# Patient Record
Sex: Female | Born: 1955 | Race: White | Hispanic: No | Smoking: Never smoker
Health system: Southern US, Community
[De-identification: ages and names within clinical notes are randomized; demographics above are authoritative.]

## PROBLEM LIST (undated history)

## (undated) DIAGNOSIS — I10 Essential (primary) hypertension: Secondary | ICD-10-CM

## (undated) DIAGNOSIS — E66811 Obesity, class 1: Secondary | ICD-10-CM

## (undated) DIAGNOSIS — E669 Obesity, unspecified: Secondary | ICD-10-CM

## (undated) DIAGNOSIS — M1990 Unspecified osteoarthritis, unspecified site: Secondary | ICD-10-CM

## (undated) DIAGNOSIS — E7800 Pure hypercholesterolemia, unspecified: Secondary | ICD-10-CM

## (undated) DIAGNOSIS — E119 Type 2 diabetes mellitus without complications: Secondary | ICD-10-CM

## (undated) DIAGNOSIS — C541 Malignant neoplasm of endometrium: Secondary | ICD-10-CM

## (undated) HISTORY — PX: WISDOM TOOTH EXTRACTION: SHX21

## (undated) HISTORY — DX: Pure hypercholesterolemia, unspecified: E78.00

## (undated) HISTORY — DX: Obesity, unspecified: E66.9

## (undated) HISTORY — DX: Malignant neoplasm of endometrium: C54.1

## (undated) HISTORY — PX: COLONOSCOPY: SHX174

## (undated) HISTORY — DX: Obesity, class 1: E66.811

---

## 1998-07-28 ENCOUNTER — Other Ambulatory Visit: Admission: RE | Admit: 1998-07-28 | Discharge: 1998-07-28 | Payer: Self-pay | Admitting: Obstetrics and Gynecology

## 1998-08-19 ENCOUNTER — Other Ambulatory Visit: Admission: RE | Admit: 1998-08-19 | Discharge: 1998-08-19 | Payer: Self-pay | Admitting: Obstetrics and Gynecology

## 1998-11-10 ENCOUNTER — Other Ambulatory Visit: Admission: RE | Admit: 1998-11-10 | Discharge: 1998-11-10 | Payer: Self-pay | Admitting: Obstetrics and Gynecology

## 1999-10-27 ENCOUNTER — Other Ambulatory Visit: Admission: RE | Admit: 1999-10-27 | Discharge: 1999-10-27 | Payer: Self-pay | Admitting: Obstetrics and Gynecology

## 2007-03-13 ENCOUNTER — Other Ambulatory Visit: Admission: RE | Admit: 2007-03-13 | Discharge: 2007-03-13 | Payer: Self-pay | Admitting: Family Medicine

## 2009-07-29 ENCOUNTER — Encounter: Admission: RE | Admit: 2009-07-29 | Discharge: 2009-07-29 | Payer: Self-pay | Admitting: Family Medicine

## 2010-06-24 ENCOUNTER — Other Ambulatory Visit: Payer: Self-pay | Admitting: Family Medicine

## 2010-06-24 DIAGNOSIS — Z1239 Encounter for other screening for malignant neoplasm of breast: Secondary | ICD-10-CM

## 2010-06-25 ENCOUNTER — Encounter: Payer: Self-pay | Admitting: Family Medicine

## 2010-07-19 ENCOUNTER — Other Ambulatory Visit (HOSPITAL_COMMUNITY)
Admission: RE | Admit: 2010-07-19 | Discharge: 2010-07-19 | Disposition: A | Discharge status: Home / Self Care | Payer: Managed Care, Other (non HMO) | Source: Ambulatory Visit | Attending: Family Medicine | Admitting: Family Medicine

## 2010-07-19 ENCOUNTER — Other Ambulatory Visit: Payer: Self-pay | Admitting: Physician Assistant

## 2010-07-19 DIAGNOSIS — Z0000 Encounter for general adult medical examination without abnormal findings: Secondary | ICD-10-CM | POA: Insufficient documentation

## 2010-08-10 ENCOUNTER — Ambulatory Visit
Admission: RE | Admit: 2010-08-10 | Discharge: 2010-08-10 | Disposition: A | Discharge status: Home / Self Care | Payer: Managed Care, Other (non HMO) | Source: Ambulatory Visit | Attending: Family Medicine | Admitting: Family Medicine

## 2010-08-10 ENCOUNTER — Ambulatory Visit: Payer: Self-pay

## 2010-08-10 DIAGNOSIS — Z1239 Encounter for other screening for malignant neoplasm of breast: Secondary | ICD-10-CM

## 2011-09-06 ENCOUNTER — Other Ambulatory Visit: Payer: Self-pay | Admitting: Family Medicine

## 2011-09-06 DIAGNOSIS — Z1231 Encounter for screening mammogram for malignant neoplasm of breast: Secondary | ICD-10-CM

## 2011-09-12 ENCOUNTER — Ambulatory Visit
Admission: RE | Admit: 2011-09-12 | Discharge: 2011-09-12 | Disposition: A | Discharge status: Home / Self Care | Payer: Managed Care, Other (non HMO) | Source: Ambulatory Visit | Attending: Family Medicine | Admitting: Family Medicine

## 2011-09-12 DIAGNOSIS — Z1231 Encounter for screening mammogram for malignant neoplasm of breast: Secondary | ICD-10-CM

## 2012-01-14 ENCOUNTER — Other Ambulatory Visit: Payer: Self-pay | Admitting: Gastroenterology

## 2012-03-26 ENCOUNTER — Encounter (HOSPITAL_COMMUNITY): Admission: RE | Payer: Self-pay | Source: Ambulatory Visit

## 2012-03-26 ENCOUNTER — Inpatient Hospital Stay (HOSPITAL_COMMUNITY)
Admission: RE | Admit: 2012-03-26 | Payer: Managed Care, Other (non HMO) | Source: Ambulatory Visit | Admitting: Orthopedic Surgery

## 2012-03-26 SURGERY — ARTHROPLASTY, KNEE, TOTAL
Anesthesia: Spinal | Laterality: Right

## 2012-08-11 ENCOUNTER — Other Ambulatory Visit: Payer: Self-pay

## 2012-08-11 DIAGNOSIS — Z1231 Encounter for screening mammogram for malignant neoplasm of breast: Secondary | ICD-10-CM

## 2012-09-12 ENCOUNTER — Ambulatory Visit
Admission: RE | Admit: 2012-09-12 | Discharge: 2012-09-12 | Disposition: A | Discharge status: Home / Self Care | Payer: BC Managed Care – PPO | Source: Ambulatory Visit

## 2012-09-12 DIAGNOSIS — Z1231 Encounter for screening mammogram for malignant neoplasm of breast: Secondary | ICD-10-CM

## 2012-10-28 ENCOUNTER — Encounter (HOSPITAL_COMMUNITY): Payer: Self-pay | Admitting: Pharmacy Technician

## 2012-11-03 ENCOUNTER — Other Ambulatory Visit (HOSPITAL_COMMUNITY): Payer: Self-pay | Admitting: Orthopedic Surgery

## 2012-11-03 NOTE — Progress Notes (Signed)
Medical clearnace note and ekg from scott gurley np dated 07-23-2012 on pt chart

## 2012-11-03 NOTE — Patient Instructions (Addendum)
20 KARLISSA ARON  11/03/2012   Your procedure is scheduled on: 11-11-2012  Report to [facility] at 600 AM.  Call this number if you have problems the morning of surgery [phone]   Remember:   Do not eat food or drink liquids :After Midnight.     Take these medicines the morning of surgery with A SIP OF WATER: no meds to take                                SEE [facility] PREPARING FOR SURGERY SHEET   Do not wear jewelry, make-up or nail polish.  Do not wear lotions, powders, or perfumes. You may wear deodorant.   Men may shave face and neck.  Do not bring valuables to the hospital.  Contacts, dentures or bridgework may not be worn into surgery.  Leave suitcase in the car. After surgery it may be brought to your room.  For patients admitted to the hospital, checkout time is 11:00 AM the day of discharge.   Patients discharged the day of surgery will not be allowed to drive home.  Name and phone number of your driver:  Special Instructions: N/A   Please read over the following fact sheets that you were given: MRSA Information, blood fact sheet, incentive spirometer fact sheet  Call Cain Sieve RN pre op nurse if needed [phone]    FAILURE TO FOLLOW THESE INSTRUCTIONS MAY RESULT IN THE CANCELLATION OF YOUR SURGERY. PATIENT SIGNATURE___________________________________________

## 2012-11-04 ENCOUNTER — Ambulatory Visit (HOSPITAL_COMMUNITY)
Admission: RE | Admit: 2012-11-04 | Discharge: 2012-11-04 | Disposition: A | Discharge status: Home / Self Care | Payer: BC Managed Care – PPO | Source: Ambulatory Visit | Attending: Orthopedic Surgery | Admitting: Orthopedic Surgery

## 2012-11-04 ENCOUNTER — Encounter (HOSPITAL_COMMUNITY): Payer: Self-pay

## 2012-11-04 ENCOUNTER — Encounter (HOSPITAL_COMMUNITY)
Admission: RE | Admit: 2012-11-04 | Discharge: 2012-11-04 | Disposition: A | Discharge status: Home / Self Care | Payer: BC Managed Care – PPO | Source: Ambulatory Visit | Attending: Orthopedic Surgery | Admitting: Orthopedic Surgery

## 2012-11-04 DIAGNOSIS — Z01812 Encounter for preprocedural laboratory examination: Secondary | ICD-10-CM | POA: Insufficient documentation

## 2012-11-04 DIAGNOSIS — Z01818 Encounter for other preprocedural examination: Secondary | ICD-10-CM | POA: Insufficient documentation

## 2012-11-04 DIAGNOSIS — I10 Essential (primary) hypertension: Secondary | ICD-10-CM | POA: Insufficient documentation

## 2012-11-04 HISTORY — DX: Type 2 diabetes mellitus without complications: E11.9

## 2012-11-04 HISTORY — DX: Unspecified osteoarthritis, unspecified site: M19.90

## 2012-11-04 HISTORY — DX: Essential (primary) hypertension: I10

## 2012-11-04 LAB — PROTIME-INR
INR: 0.95 (ref 0.00–1.49)
Prothrombin Time: 12.6 seconds (ref 11.6–15.2)

## 2012-11-04 LAB — CBC
Hemoglobin: 15.1 g/dL — ABNORMAL HIGH (ref 12.0–15.0)
MCH: 28.1 pg (ref 26.0–34.0)
RBC: 5.37 MIL/uL — ABNORMAL HIGH (ref 3.87–5.11)

## 2012-11-04 LAB — BASIC METABOLIC PANEL
GFR calc Af Amer: >90 mL/min (ref >90)
GFR calc non Af Amer: >90 mL/min (ref >90)
Glucose, Bld: 111 mg/dL — ABNORMAL HIGH (ref 70–99)
Potassium: 3.5 mEq/L (ref 3.5–5.1)
Sodium: 138 mEq/L (ref 135–145)

## 2012-11-04 LAB — URINE MICROSCOPIC-ADD ON

## 2012-11-04 LAB — URINALYSIS, ROUTINE W REFLEX MICROSCOPIC
Leukocytes, UA: NEGATIVE
Nitrite: NEGATIVE
Specific Gravity, Urine: 1.016 (ref 1.005–1.030)
Urobilinogen, UA: 0.2 mg/dL (ref 0.0–1.0)

## 2012-11-04 LAB — SURGICAL PCR SCREEN: MRSA, PCR: NEGATIVE

## 2012-11-04 NOTE — Progress Notes (Signed)
Micro ua results faxed to dr olin by epic 

## 2012-11-07 NOTE — Progress Notes (Signed)
Fax received from dr Charlann Boxer and placed on pt chart cipro 500 mg bid x 5 days called in for pt to pharmcy

## 2012-11-09 NOTE — H&P (Signed)
TOTAL KNEE ADMISSION H&P  Patient is being admitted for right total knee arthroplasty.  Subjective:  Chief Complaint:   Right knee OA / pain.  HPI: Tracey Foster, 57 y.o. female, has a history of pain and functional disability in the right knee due to arthritis and has failed non-surgical conservative treatments for greater than 12 weeks to includeNSAID's and/or analgesics and activity modification.  Onset of symptoms was gradual, starting 5 years ago with gradually worsening course since that time. The patient noted no past surgery on the right knee(s).  Patient currently rates pain in the right knee(s) at 8 out of 10 with activity. Patient has worsening of pain with activity and weight bearing, pain that interferes with activities of daily living, pain with passive range of motion, crepitus and with either extended sitting or standing.  Patient has evidence of periarticular osteophytes and joint space narrowing by imaging studies. There is no active signs of infection.  Risks, benefits and expectations were discussed with the patient. Patient understand the risks, benefits and expectations and wishes to proceed with surgery.   D/C Plans:   Home with HHPT  Post-op Meds:     Rx given for ASA, Robaxin, Celebrex, Iron, Colace and MiraLax  Tranexamic Acid:   To be given  Decadron:    Not to be given - DM  FYI:    ASA post-op    Past Medical History  Diagnosis Date  . Hypertension   . Diabetes mellitus without complication     diet controlled no meds  . Arthritis     Past Surgical History  Procedure Laterality Date  . No past surgeries      No prescriptions prior to admission   Allergies  Allergen Reactions  . Penicillins Hives    History  Substance Use Topics  . Smoking status: Never Smoker   . Smokeless tobacco: Never Used  . Alcohol Use: No    No family history on file.   Review of Systems  Constitutional: Negative.   HENT: Negative.   Eyes: Negative.   Respiratory:  Negative.   Gastrointestinal: Negative.   Genitourinary: Negative.   Musculoskeletal: Positive for joint pain.  Skin: Negative.   Neurological: Negative.   Endo/Heme/Allergies: Negative.   Psychiatric/Behavioral: Negative.     Objective:  Physical Exam  Constitutional: She is oriented to person, place, and time. She appears well-developed and well-nourished.  HENT:  Head: Normocephalic and atraumatic.  Mouth/Throat: Oropharynx is clear and moist.  Eyes: Pupils are equal, round, and reactive to light.  Neck: Neck supple. No JVD present. No tracheal deviation present. No thyromegaly present.  Cardiovascular: Normal rate, regular rhythm, normal heart sounds and intact distal pulses.   Respiratory: Effort normal and breath sounds normal. No stridor.  GI: Soft. There is no tenderness. There is no guarding.  Musculoskeletal:       Right knee: She exhibits decreased range of motion, swelling and bony tenderness. She exhibits no effusion, no ecchymosis, no deformity, no laceration and no erythema. Tenderness found.  Lymphadenopathy:    She has no cervical adenopathy.  Neurological: She is alert and oriented to person, place, and time.  Skin: Skin is warm and dry.  Psychiatric: She has a normal mood and affect.    Imaging Review Plain radiographs demonstrate severe degenerative joint disease of the right knee(s). The overall alignment is neutral. The bone quality appears to be good for age and reported activity level.  Assessment/Plan:  End stage arthritis, right knee  The patient history, physical examination, clinical judgment of the provider and imaging studies are consistent with end stage degenerative joint disease of the right knee(s) and total knee arthroplasty is deemed medically necessary. The treatment options including medical management, injection therapy arthroscopy and arthroplasty were discussed at length. The risks and benefits of total knee arthroplasty were presented  and reviewed. The risks due to aseptic loosening, infection, stiffness, patella tracking problems, thromboembolic complications and other imponderables were discussed. The patient acknowledged the explanation, agreed to proceed with the plan and consent was signed. Patient is being admitted for inpatient treatment for surgery, pain control, PT, OT, prophylactic antibiotics, VTE prophylaxis, progressive ambulation and ADL's and discharge planning. The patient is planning to be discharged home with home health services.   Anastasio Auerbach Semir Brill   PAC  11/09/2012, 8:42 PM

## 2012-11-11 ENCOUNTER — Ambulatory Visit (HOSPITAL_COMMUNITY): Payer: BC Managed Care – PPO | Admitting: Anesthesiology

## 2012-11-11 ENCOUNTER — Encounter (HOSPITAL_COMMUNITY)
Admission: RE | Disposition: A | Discharge status: Home Health | Payer: Self-pay | Source: Ambulatory Visit | Attending: Orthopedic Surgery

## 2012-11-11 ENCOUNTER — Encounter (HOSPITAL_COMMUNITY): Payer: Self-pay | Admitting: *Deleted

## 2012-11-11 ENCOUNTER — Inpatient Hospital Stay (HOSPITAL_COMMUNITY)
Admission: RE | Admit: 2012-11-11 | Discharge: 2012-11-12 | DRG: 209 | Disposition: A | Discharge status: Home Health | Payer: BC Managed Care – PPO | Source: Ambulatory Visit | Attending: Orthopedic Surgery | Admitting: Orthopedic Surgery

## 2012-11-11 ENCOUNTER — Encounter (HOSPITAL_COMMUNITY): Payer: Self-pay | Admitting: Anesthesiology

## 2012-11-11 DIAGNOSIS — M1710 Unilateral primary osteoarthritis, unspecified knee: Principal | ICD-10-CM | POA: Diagnosis present

## 2012-11-11 DIAGNOSIS — Z79899 Other long term (current) drug therapy: Secondary | ICD-10-CM

## 2012-11-11 DIAGNOSIS — I10 Essential (primary) hypertension: Secondary | ICD-10-CM | POA: Diagnosis present

## 2012-11-11 DIAGNOSIS — D500 Iron deficiency anemia secondary to blood loss (chronic): Secondary | ICD-10-CM | POA: Diagnosis not present

## 2012-11-11 DIAGNOSIS — D62 Acute posthemorrhagic anemia: Secondary | ICD-10-CM | POA: Diagnosis not present

## 2012-11-11 DIAGNOSIS — Z96659 Presence of unspecified artificial knee joint: Secondary | ICD-10-CM

## 2012-11-11 DIAGNOSIS — Z96651 Presence of right artificial knee joint: Secondary | ICD-10-CM

## 2012-11-11 DIAGNOSIS — Z6841 Body Mass Index (BMI) 40.0 and over, adult: Secondary | ICD-10-CM

## 2012-11-11 DIAGNOSIS — E119 Type 2 diabetes mellitus without complications: Secondary | ICD-10-CM | POA: Diagnosis present

## 2012-11-11 HISTORY — PX: TOTAL KNEE ARTHROPLASTY: SHX125

## 2012-11-11 LAB — GLUCOSE, CAPILLARY
Glucose-Capillary: 120 mg/dL — ABNORMAL HIGH (ref 70–99)
Glucose-Capillary: 79 mg/dL (ref 70–99)

## 2012-11-11 LAB — ABO/RH: ABO/RH(D): O POS

## 2012-11-11 LAB — TYPE AND SCREEN: Antibody Screen: NEGATIVE

## 2012-11-11 SURGERY — ARTHROPLASTY, KNEE, TOTAL
Anesthesia: Spinal | Site: Knee | Laterality: Right | Wound class: Clean

## 2012-11-11 MED ORDER — BUPIVACAINE LIPOSOME 1.3 % IJ SUSP
20.0000 mL | Freq: Once | INTRAMUSCULAR | Status: DC
  Filled 2012-11-11: qty 20

## 2012-11-11 MED ORDER — ONDANSETRON HCL 4 MG/2ML IJ SOLN
4.0000 mg | Freq: Four times a day (QID) | INTRAMUSCULAR | Status: DC | PRN
  Administered 2012-11-12: 4 mg via INTRAVENOUS
  Filled 2012-11-11: qty 2

## 2012-11-11 MED ORDER — SODIUM CHLORIDE 0.9 % IV SOLN
INTRAVENOUS | Status: DC
  Administered 2012-11-11 – 2012-11-12 (×2): via INTRAVENOUS
  Filled 2012-11-11 (×5): qty 1000

## 2012-11-11 MED ORDER — INSULIN ASPART 100 UNIT/ML ~~LOC~~ SOLN: 0.0000 [IU] | Freq: Three times a day (TID) | SUBCUTANEOUS | Status: DC

## 2012-11-11 MED ORDER — PROMETHAZINE HCL 25 MG/ML IJ SOLN: 6.2500 mg | INTRAMUSCULAR | Status: DC | PRN

## 2012-11-11 MED ORDER — TRANEXAMIC ACID 100 MG/ML IV SOLN
1000.0000 mg | Freq: Once | INTRAVENOUS | Status: AC
  Administered 2012-11-11: 1000 mg via INTRAVENOUS
  Filled 2012-11-11: qty 10

## 2012-11-11 MED ORDER — EZETIMIBE-SIMVASTATIN 10-40 MG PO TABS
1.0000 | ORAL_TABLET | Freq: Every day | ORAL | Status: DC
  Administered 2012-11-11: 1 via ORAL
  Filled 2012-11-11 (×2): qty 1

## 2012-11-11 MED ORDER — BUPIVACAINE-EPINEPHRINE PF 0.25-1:200000 % IJ SOLN
INTRAMUSCULAR | Status: AC
  Filled 2012-11-11: qty 30

## 2012-11-11 MED ORDER — CELECOXIB 200 MG PO CAPS
200.0000 mg | ORAL_CAPSULE | Freq: Two times a day (BID) | ORAL | Status: DC
  Administered 2012-11-11 – 2012-11-12 (×2): 200 mg via ORAL
  Filled 2012-11-11 (×3): qty 1

## 2012-11-11 MED ORDER — DEXTROSE 5 % IV SOLN
500.0000 mg | Freq: Four times a day (QID) | INTRAVENOUS | Status: DC | PRN
  Filled 2012-11-11: qty 5

## 2012-11-11 MED ORDER — HYDROCODONE-ACETAMINOPHEN 7.5-325 MG PO TABS
1.0000 | ORAL_TABLET | ORAL | Status: DC
  Administered 2012-11-11 (×2): 2 via ORAL
  Administered 2012-11-11 – 2012-11-12 (×2): 1 via ORAL
  Administered 2012-11-12 (×3): 2 via ORAL
  Filled 2012-11-11 (×2): qty 2
  Filled 2012-11-11: qty 1
  Filled 2012-11-11 (×2): qty 2
  Filled 2012-11-11: qty 1
  Filled 2012-11-11: qty 2

## 2012-11-11 MED ORDER — LACTATED RINGERS IV SOLN
INTRAVENOUS | Status: DC | PRN
  Administered 2012-11-11 (×2): via INTRAVENOUS

## 2012-11-11 MED ORDER — CLINDAMYCIN PHOSPHATE 900 MG/50ML IV SOLN
INTRAVENOUS | Status: AC
  Filled 2012-11-11: qty 50

## 2012-11-11 MED ORDER — LACTATED RINGERS IV SOLN: INTRAVENOUS | Status: DC

## 2012-11-11 MED ORDER — HYDROMORPHONE HCL PF 1 MG/ML IJ SOLN: 0.5000 mg | INTRAMUSCULAR | Status: DC | PRN

## 2012-11-11 MED ORDER — ALUM & MAG HYDROXIDE-SIMETH 200-200-20 MG/5ML PO SUSP: 30.0000 mL | ORAL | Status: DC | PRN

## 2012-11-11 MED ORDER — 0.9 % SODIUM CHLORIDE (POUR BTL) OPTIME
TOPICAL | Status: DC | PRN
  Administered 2012-11-11: 1000 mL

## 2012-11-11 MED ORDER — MEPERIDINE HCL 50 MG/ML IJ SOLN: 6.2500 mg | INTRAMUSCULAR | Status: DC | PRN

## 2012-11-11 MED ORDER — BISACODYL 10 MG RE SUPP: 10.0000 mg | Freq: Every day | RECTAL | Status: DC | PRN

## 2012-11-11 MED ORDER — SODIUM CHLORIDE 0.9 % IJ SOLN
INTRAMUSCULAR | Status: DC | PRN
  Administered 2012-11-11: 09:00:00

## 2012-11-11 MED ORDER — MIDAZOLAM HCL 5 MG/5ML IJ SOLN
INTRAMUSCULAR | Status: DC | PRN
  Administered 2012-11-11: 2 mg via INTRAVENOUS

## 2012-11-11 MED ORDER — FERROUS SULFATE 325 (65 FE) MG PO TABS
325.0000 mg | ORAL_TABLET | Freq: Three times a day (TID) | ORAL | Status: DC
  Administered 2012-11-12 (×2): 325 mg via ORAL
  Filled 2012-11-11 (×4): qty 1

## 2012-11-11 MED ORDER — SODIUM CHLORIDE 0.9 % IR SOLN
Status: DC | PRN
  Administered 2012-11-11: 1000 mL

## 2012-11-11 MED ORDER — ASPIRIN EC 325 MG PO TBEC
325.0000 mg | DELAYED_RELEASE_TABLET | Freq: Two times a day (BID) | ORAL | Status: DC
  Administered 2012-11-12: 325 mg via ORAL
  Filled 2012-11-11 (×3): qty 1

## 2012-11-11 MED ORDER — CLINDAMYCIN PHOSPHATE 600 MG/50ML IV SOLN
600.0000 mg | Freq: Four times a day (QID) | INTRAVENOUS | Status: AC
  Administered 2012-11-11 (×2): 600 mg via INTRAVENOUS
  Filled 2012-11-11 (×2): qty 50

## 2012-11-11 MED ORDER — PROPOFOL INFUSION 10 MG/ML OPTIME
INTRAVENOUS | Status: DC | PRN
  Administered 2012-11-11: 75 ug/kg/min via INTRAVENOUS

## 2012-11-11 MED ORDER — POLYETHYLENE GLYCOL 3350 17 G PO PACK
17.0000 g | PACK | Freq: Two times a day (BID) | ORAL | Status: DC
  Administered 2012-11-11 – 2012-11-12 (×2): 17 g via ORAL

## 2012-11-11 MED ORDER — METOCLOPRAMIDE HCL 10 MG PO TABS: 5.0000 mg | ORAL_TABLET | Freq: Three times a day (TID) | ORAL | Status: DC | PRN

## 2012-11-11 MED ORDER — CLINDAMYCIN PHOSPHATE 900 MG/50ML IV SOLN
900.0000 mg | Freq: Once | INTRAVENOUS | Status: AC
  Administered 2012-11-11: 900 mg via INTRAVENOUS

## 2012-11-11 MED ORDER — STERILE WATER FOR IRRIGATION IR SOLN
Status: DC | PRN
  Administered 2012-11-11: 3000 mL

## 2012-11-11 MED ORDER — ONDANSETRON HCL 4 MG PO TABS: 4.0000 mg | ORAL_TABLET | Freq: Four times a day (QID) | ORAL | Status: DC | PRN

## 2012-11-11 MED ORDER — FLEET ENEMA 7-19 GM/118ML RE ENEM: 1.0000 | ENEMA | Freq: Once | RECTAL | Status: AC | PRN

## 2012-11-11 MED ORDER — METOCLOPRAMIDE HCL 5 MG/ML IJ SOLN: 5.0000 mg | Freq: Three times a day (TID) | INTRAMUSCULAR | Status: DC | PRN

## 2012-11-11 MED ORDER — DOCUSATE SODIUM 100 MG PO CAPS
100.0000 mg | ORAL_CAPSULE | Freq: Two times a day (BID) | ORAL | Status: DC
  Administered 2012-11-11 – 2012-11-12 (×2): 100 mg via ORAL

## 2012-11-11 MED ORDER — KETOROLAC TROMETHAMINE 30 MG/ML IJ SOLN
INTRAMUSCULAR | Status: AC
  Filled 2012-11-11: qty 1

## 2012-11-11 MED ORDER — BUPIVACAINE-EPINEPHRINE PF 0.25-1:200000 % IJ SOLN
INTRAMUSCULAR | Status: DC | PRN
  Administered 2012-11-11: 25 mL

## 2012-11-11 MED ORDER — METHOCARBAMOL 500 MG PO TABS
500.0000 mg | ORAL_TABLET | Freq: Four times a day (QID) | ORAL | Status: DC | PRN
  Administered 2012-11-12: 500 mg via ORAL
  Filled 2012-11-11: qty 1

## 2012-11-11 MED ORDER — PHENOL 1.4 % MT LIQD: 1.0000 | OROMUCOSAL | Status: DC | PRN

## 2012-11-11 MED ORDER — ZOLPIDEM TARTRATE 5 MG PO TABS: 5.0000 mg | ORAL_TABLET | Freq: Every evening | ORAL | Status: DC | PRN

## 2012-11-11 MED ORDER — KETOROLAC TROMETHAMINE 30 MG/ML IJ SOLN
INTRAMUSCULAR | Status: DC | PRN
  Administered 2012-11-11: 30 mg via INTRAVENOUS

## 2012-11-11 MED ORDER — MENTHOL 3 MG MT LOZG: 1.0000 | LOZENGE | OROMUCOSAL | Status: DC | PRN

## 2012-11-11 MED ORDER — DIPHENHYDRAMINE HCL 25 MG PO CAPS: 25.0000 mg | ORAL_CAPSULE | Freq: Four times a day (QID) | ORAL | Status: DC | PRN

## 2012-11-11 MED ORDER — FENTANYL CITRATE 0.05 MG/ML IJ SOLN: 25.0000 ug | INTRAMUSCULAR | Status: DC | PRN

## 2012-11-11 MED ORDER — BUPIVACAINE IN DEXTROSE 0.75-8.25 % IT SOLN
INTRATHECAL | Status: DC | PRN
  Administered 2012-11-11: 1.6 mL via INTRATHECAL

## 2012-11-11 SURGICAL SUPPLY — 56 items
BAG ZIPLOCK 12X15 (MISCELLANEOUS) ×2 IMPLANT
BANDAGE ELASTIC 6 VELCRO ST LF (GAUZE/BANDAGES/DRESSINGS) ×2 IMPLANT
BANDAGE ESMARK 6X9 LF (GAUZE/BANDAGES/DRESSINGS) ×1 IMPLANT
BLADE SAW SGTL 13.0X1.19X90.0M (BLADE) ×2 IMPLANT
BNDG ESMARK 6X9 LF (GAUZE/BANDAGES/DRESSINGS) ×2
BONE CEMENT GENTAMICIN (Cement) ×4 IMPLANT
BOWL SMART MIX CTS (DISPOSABLE) ×2 IMPLANT
CAPT RP KNEE ×2 IMPLANT
CEMENT BONE GENTAMICIN 40 (Cement) ×2 IMPLANT
CLOTH BEACON ORANGE TIMEOUT ST (SAFETY) ×2 IMPLANT
CUFF TOURN SGL QUICK 34 (TOURNIQUET CUFF) ×1
CUFF TRNQT CYL 34X4X40X1 (TOURNIQUET CUFF) ×1 IMPLANT
DECANTER SPIKE VIAL GLASS SM (MISCELLANEOUS) ×2 IMPLANT
DERMABOND ADVANCED (GAUZE/BANDAGES/DRESSINGS) ×1
DERMABOND ADVANCED .7 DNX12 (GAUZE/BANDAGES/DRESSINGS) ×1 IMPLANT
DRAPE EXTREMITY T 121X128X90 (DRAPE) ×2 IMPLANT
DRAPE POUCH INSTRU U-SHP 10X18 (DRAPES) ×2 IMPLANT
DRAPE U-SHAPE 47X51 STRL (DRAPES) ×2 IMPLANT
DRSG AQUACEL AG ADV 3.5X10 (GAUZE/BANDAGES/DRESSINGS) ×2 IMPLANT
DRSG TEGADERM 4X4.75 (GAUZE/BANDAGES/DRESSINGS) ×2 IMPLANT
DURAPREP 26ML APPLICATOR (WOUND CARE) ×2 IMPLANT
ELECT REM PT RETURN 9FT ADLT (ELECTROSURGICAL) ×2
ELECTRODE REM PT RTRN 9FT ADLT (ELECTROSURGICAL) ×1 IMPLANT
EVACUATOR 1/8 PVC DRAIN (DRAIN) ×2 IMPLANT
FACESHIELD LNG OPTICON STERILE (SAFETY) ×10 IMPLANT
GAUZE SPONGE 2X2 8PLY STRL LF (GAUZE/BANDAGES/DRESSINGS) ×1 IMPLANT
GLOVE BIOGEL PI IND STRL 7.5 (GLOVE) ×1 IMPLANT
GLOVE BIOGEL PI IND STRL 8 (GLOVE) ×1 IMPLANT
GLOVE BIOGEL PI INDICATOR 7.5 (GLOVE) ×1
GLOVE BIOGEL PI INDICATOR 8 (GLOVE) ×1
GLOVE ECLIPSE 8.0 STRL XLNG CF (GLOVE) ×2 IMPLANT
GLOVE ORTHO TXT STRL SZ7.5 (GLOVE) ×4 IMPLANT
GOWN BRE IMP PREV XXLGXLNG (GOWN DISPOSABLE) ×2 IMPLANT
GOWN STRL NON-REIN LRG LVL3 (GOWN DISPOSABLE) ×2 IMPLANT
HANDPIECE INTERPULSE COAX TIP (DISPOSABLE) ×1
KIT BASIN OR (CUSTOM PROCEDURE TRAY) ×2 IMPLANT
MANIFOLD NEPTUNE II (INSTRUMENTS) ×2 IMPLANT
NDL SAFETY ECLIPSE 18X1.5 (NEEDLE) ×1 IMPLANT
NEEDLE HYPO 18GX1.5 SHARP (NEEDLE) ×1
NS IRRIG 1000ML POUR BTL (IV SOLUTION) ×4 IMPLANT
PACK TOTAL JOINT (CUSTOM PROCEDURE TRAY) ×2 IMPLANT
POSITIONER SURGICAL ARM (MISCELLANEOUS) ×2 IMPLANT
SET HNDPC FAN SPRY TIP SCT (DISPOSABLE) ×1 IMPLANT
SET PAD KNEE POSITIONER (MISCELLANEOUS) ×2 IMPLANT
SPONGE GAUZE 2X2 STER 10/PKG (GAUZE/BANDAGES/DRESSINGS) ×1
SUCTION FRAZIER 12FR DISP (SUCTIONS) ×2 IMPLANT
SUT MNCRL AB 4-0 PS2 18 (SUTURE) ×2 IMPLANT
SUT VIC AB 1 CT1 36 (SUTURE) ×2 IMPLANT
SUT VIC AB 2-0 CT1 27 (SUTURE) ×3
SUT VIC AB 2-0 CT1 TAPERPNT 27 (SUTURE) ×3 IMPLANT
SUT VLOC 180 0 24IN GS25 (SUTURE) ×2 IMPLANT
SYR 50ML LL SCALE MARK (SYRINGE) ×2 IMPLANT
TOWEL OR 17X26 10 PK STRL BLUE (TOWEL DISPOSABLE) ×4 IMPLANT
TRAY FOLEY CATH 14FRSI W/METER (CATHETERS) ×2 IMPLANT
WATER STERILE IRR 1500ML POUR (IV SOLUTION) ×4 IMPLANT
WRAP KNEE MAXI GEL POST OP (GAUZE/BANDAGES/DRESSINGS) ×2 IMPLANT

## 2012-11-11 NOTE — Transfer of Care (Signed)
Immediate Anesthesia Transfer of Care Note  Patient: Tracey Foster  Procedure(s) Performed: Procedure(s): RIGHT TOTAL KNEE ARTHROPLASTY (Right)  Patient Location: PACU  Anesthesia Type:Regional  Level of Consciousness: awake, alert  and oriented  Airway & Oxygen Therapy: Patient Spontanous Breathing and Patient connected to face mask oxygen  Post-op Assessment: Report given to PACU RN and Post -op Vital signs reviewed and stable  Post vital signs: Reviewed and stable  Complications: No apparent anesthesia complications

## 2012-11-11 NOTE — Preoperative (Signed)
Beta Blockers   Reason not to administer Beta Blockers:Not Applicable 

## 2012-11-11 NOTE — Progress Notes (Signed)
Received orders for rw and commode.  Patient has both at home.  No DME needs at this time.  

## 2012-11-11 NOTE — Anesthesia Postprocedure Evaluation (Signed)
  Anesthesia Post-op Note  Patient: Tracey Foster  Procedure(s) Performed: Procedure(s) (LRB): RIGHT TOTAL KNEE ARTHROPLASTY (Right)  Patient Location: PACU  Anesthesia Type: Spinal  Level of Consciousness: awake and alert   Airway and Oxygen Therapy: Patient Spontanous Breathing  Post-op Pain: mild  Post-op Assessment: Post-op Vital signs reviewed, Patient's Cardiovascular Status Stable, Respiratory Function Stable, Patent Airway and No signs of Nausea or vomiting  Last Vitals:  Filed Vitals:   11/11/12 1215  BP: 150/80  Pulse: 58  Temp: 36.3 C  Resp: 14    Post-op Vital Signs: stable   Complications: No apparent anesthesia complications

## 2012-11-11 NOTE — Evaluation (Signed)
Physical Therapy Evaluation Patient Details Name: Tracey Foster MRN: [id number] DOB: 1955/10/19 Today's Date: 11/11/2012 Time: 4098-1191 PT Time Calculation (min): 32 min  PT Assessment / Plan / Recommendation Clinical Impression  s/p R TKA and will benefit from PT to maximize independence for home with dtr assist     PT Assessment  Patient needs continued PT services    Follow Up Recommendations  Home health PT    Does the patient have the potential to tolerate intense rehabilitation      Barriers to Discharge        Equipment Recommendations   (has standard walker)    Recommendations for Other Services     Frequency 7X/week    Precautions / Restrictions Precautions Precautions: Knee Restrictions Weight Bearing Restrictions: No RLE Weight Bearing: Weight bearing as tolerated   Pertinent Vitals/Pain Min c/o pain      Mobility  Bed Mobility Bed Mobility: Supine to Sit Supine to Sit: 4: Min assist Details for Bed Mobility Assistance: cues for technique Transfers Transfers: Sit to Stand;Stand to Sit Sit to Stand: 4: Min assist;3: Mod assist Stand to Sit: 4: Min assist Details for Transfer Assistance: cues for hand placement and LE management Ambulation/Gait Ambulation/Gait Assistance: 4: Min assist Ambulation Distance (Feet): 30 Feet Assistive device: Rolling walker Gait Pattern: Step-to pattern;Decreased stride length    Exercises Total Joint Exercises Ankle Circles/Pumps: AROM;10 reps;Both   PT Diagnosis: Difficulty walking  PT Problem List: Decreased strength;Decreased mobility;Decreased range of motion;Decreased activity tolerance;Decreased knowledge of use of DME PT Treatment Interventions: Therapeutic activities;DME instruction;Gait training;Stair training;Functional mobility training;Therapeutic exercise;Patient/family education   PT Goals Acute Rehab PT Goals PT Goal Formulation: With patient Time For Goal Achievement: 11/18/12 Potential to Achieve  Goals: Good Pt will go Supine/Side to Sit: with supervision PT Goal: Supine/Side to Sit - Progress: Goal set today Pt will go Sit to Stand: with supervision PT Goal: Sit to Stand - Progress: Goal set today Pt will Ambulate: 51 - 150 feet;with supervision PT Goal: Ambulate - Progress: Goal set today Pt will Go Up / Down Stairs: 1-2 stairs;with min assist;with least restrictive assistive device;with rail(s) PT Goal: Up/Down Stairs - Progress: Goal set today  Visit Information  Last PT Received On: 11/11/12 Assistance Needed: +1    Subjective Data  Patient Stated Goal: home   Prior Functioning  Home Living Lives With: Daughter Available Help at Discharge: Family Type of Home: House Home Access: Stairs to enter Secretary/administrator of Steps: 3 total; last 2 step have rails Home Layout: Two level Home Adaptive Equipment: Walker - standard Additional Comments: riser Prior Function Level of Independence: Independent Able to Take Stairs?: Yes Driving: Yes Communication Communication: No difficulties    Cognition  Cognition Arousal/Alertness: Awake/alert Overall Cognitive Status: Within Functional Limits for tasks assessed    Extremity/Trunk Assessment Right Upper Extremity Assessment RUE ROM/Strength/Tone: [facility] for tasks assessed Left Upper Extremity Assessment LUE ROM/Strength/Tone: WFL for tasks assessed LUE ROM/Strength/Tone Deficits: able to assist wtih SLR; ankle WFL LUE Sensation: WFL - Light Touch Left Lower Extremity Assessment LLE ROM/Strength/Tone: WFL for tasks assessed;Deficits LLE ROM/Strength/Tone Deficits: pt states this is her worst leg LLE Sensation: WFL - Light Touch   Balance    End of Session PT - End of Session Equipment Utilized During Treatment: Gait belt Activity Tolerance: Patient tolerated treatment well Patient left: in chair;with call bell/phone within reach;with family/visitor present  GP     [facility] 11/11/2012, 4:13 PM

## 2012-11-11 NOTE — Anesthesia Procedure Notes (Signed)
Spinal Patient location during procedure: OR Staffing Anesthesiologist: Jerold Yoss Performed by: anesthesiologist  Preanesthetic Checklist Completed: patient identified, site marked, surgical consent, pre-op evaluation, timeout performed, IV checked, risks and benefits discussed and monitors and equipment checked Spinal Block Patient position: sitting Prep: Betadine Patient monitoring: heart rate, continuous pulse ox and blood pressure Approach: right paramedian Location: L4-5 Injection technique: single-shot Needle Needle type: Spinocan  Needle gauge: 22 G Needle length: 9 cm Additional Notes Expiration date of kit checked and confirmed. Patient tolerated procedure well, without complications.     

## 2012-11-11 NOTE — Interval H&P Note (Signed)
History and Physical Interval Note:  11/11/2012 7:02 AM  Ellis Parents  has presented today for surgery, with the diagnosis of RIGHT KNEE OSTEOARTHRITIS  The various methods of treatment have been discussed with the patient and family. After consideration of risks, benefits and other options for treatment, the patient has consented to  Procedure(s): RIGHT TOTAL KNEE ARTHROPLASTY (Right) as a surgical intervention .  The patient's history has been reviewed, patient examined, no change in status, stable for surgery.  I have reviewed the patient's chart and labs.  Questions were answered to the patient's satisfaction.     Shelda Pal

## 2012-11-11 NOTE — Anesthesia Preprocedure Evaluation (Addendum)
Anesthesia Evaluation  Patient identified by MRN, date of birth, ID band Patient awake    Reviewed: Allergy & Precautions, H&P , NPO status , Patient's Chart, lab work & pertinent test results  Airway Mallampati: II TM Distance: >3 FB Neck ROM: Full    Dental no notable dental hx.    Pulmonary neg pulmonary ROS,  breath sounds clear to auscultation  Pulmonary exam normal       Cardiovascular hypertension, negative cardio ROS  Rhythm:Regular Rate:Normal     Neuro/Psych negative neurological ROS  negative psych ROS   GI/Hepatic negative GI ROS, Neg liver ROS,   Endo/Other  negative endocrine ROS  Renal/GU negative Renal ROS  negative genitourinary   Musculoskeletal negative musculoskeletal ROS (+)   Abdominal   Peds negative pediatric ROS (+)  Hematology negative hematology ROS (+)   Anesthesia Other Findings   Reproductive/Obstetrics negative OB ROS                           Anesthesia Physical Anesthesia Plan  ASA: II  Anesthesia Plan: Spinal   Post-op Pain Management:    Induction: Intravenous  Airway Management Planned:   Additional Equipment:   Intra-op Plan:   Post-operative Plan:   Informed Consent: I have reviewed the patients History and Physical, chart, labs and discussed the procedure including the risks, benefits and alternatives for the proposed anesthesia with the patient or authorized representative who has indicated his/her understanding and acceptance.   Dental advisory given  Plan Discussed with: CRNA  Anesthesia Plan Comments:         Anesthesia Quick Evaluation

## 2012-11-11 NOTE — Progress Notes (Signed)
Utilization review completed.  

## 2012-11-11 NOTE — Op Note (Signed)
NAME:  Tracey Foster                      MEDICAL RECORD NO.:  [id number]                             FACILITY:  [facility]      PHYSICIAN:  Madlyn Frankel. Charlann Boxer, M.D.  DATE OF BIRTH:  1956-02-16      DATE OF PROCEDURE:  11/11/2012                                     OPERATIVE REPORT         PREOPERATIVE DIAGNOSIS:  Right knee osteoarthritis.      POSTOPERATIVE DIAGNOSIS:  Right knee osteoarthritis.      FINDINGS:  The patient was noted to have complete loss of cartilage and   bone-on-bone arthritis with associated osteophytes in all three compartments of   the knee with a significant synovitis and associated effusion and significant genu varum     PROCEDURE:  Right total knee replacement.      COMPONENTS USED:  DePuy rotating platform posterior stabilized knee   system, a size 3 femur, 3 tibia, 12.5 mm PS insert, and 38 patellar   button.      SURGEON:  Madlyn Frankel. Charlann Boxer, M.D.      ASSISTANT:  Lanney Gins, PA-C.      ANESTHESIA:  Spinal.      SPECIMENS:  None.      COMPLICATION:  None.      DRAINS:  One Hemovac.  EBL: <100cc      TOURNIQUET TIME:   Total Tourniquet Time Documented: Thigh (Right) - 35 minutes Total: Thigh (Right) - 35 minutes  .      The patient was stable to the recovery room.      INDICATION FOR PROCEDURE:  Tracey Foster is a 57 y.o. female patient of   mine.  The patient had been seen, evaluated, and treated conservatively in the   office with medication, activity modification, and injections.  The patient had   radiographic changes of bone-on-bone arthritis with endplate sclerosis and osteophytes noted.      The patient failed conservative measures including medication, injections, and activity modification, and at this point was ready for more definitive measures.   Based on the radiographic changes and failed conservative measures, the patient   decided to proceed with total knee replacement.  Risks of infection,   DVT, component failure, need for  revision surgery, postop course, and   expectations were all   discussed and reviewed.  Consent was obtained for benefit of pain   relief.      PROCEDURE IN DETAIL:  The patient was brought to the operative theater.   Once adequate anesthesia, preoperative antibiotics, 900mg  of Clindamycin administered, the patient was positioned supine with the right thigh tourniquet placed.  The  right lower extremity was prepped and draped in sterile fashion.  A time-   out was performed identifying the patient, planned procedure, and   extremity.      The right lower extremity was placed in the [facility] leg holder.  The leg was   exsanguinated, tourniquet elevated to 250 mmHg.  A midline incision was   made followed by median parapatellar arthrotomy.  Following initial   exposure, attention was  first directed to the patella.  Precut   measurement was noted to be 23 mm.  I resected down to 13 mm and used a   38 patellar button to restore patellar height as well as cover the cut   surface.      The lug holes were drilled and a metal shim was placed to protect the   patella from retractors and saw blades.      At this point, attention was now directed to the femur.  The femoral   canal was opened with a drill, irrigated to try to prevent fat emboli.  An   intramedullary rod was passed at 3 degrees valgus, 10 mm of bone was   resected off the distal femur.  Following this resection, the tibia was   subluxated anteriorly.  Using the extramedullary guide, 10 mm of bone was resected off   the proximal lateral tibia.  We confirmed the gap would be   stable medially and laterally with a 10 mm insert as well as confirmed   the cut was perpendicular in the coronal plane, checking with an alignment rod.      Once this was done, I sized the femur to be a size 3 in the anterior-   posterior dimension, chose a 3 standard component based on medial and   lateral dimension.  The size 3 rotation block was then pinned  in   position anterior referenced using the C-clamp to set rotation.  The   anterior, posterior, and  chamfer cuts were made without difficulty nor   notching making certain that I was along the anterior cortex to help   with flexion gap stability.      The final box cut was made off the lateral aspect of distal femur.      At this point, the tibia was sized to be a size 3, the size 3 tray was   then pinned in position through the medial third of the tubercle,   drilled, and keel punched.  Trial reduction was now carried with a 3 femur,  3 tibia, a 12.5 mm insert, and the 38 patella botton.  The knee was brought to   extension, full extension with good flexion stability with the patella   tracking through the trochlea without application of pressure.  Given   all these findings, the trial components removed.  Final components were   opened and cement was mixed.  The knee was irrigated with normal saline   solution and pulse lavage.  The synovial lining was   then injected with 0.25% Marcaine with epinephrine and 1 cc of Toradol,   total of 61 cc.      The knee was irrigated.  Final implants were then cemented onto clean and   dried cut surfaces of bone with the knee brought to extension with a 12.5 mm trial insert.      Once the cement had fully cured, the excess cement was removed   throughout the knee.  I confirmed I was satisfied with the range of   motion and stability, and the final 12.5 mm PS insert was chosen.  It was   placed into the knee.      The tourniquet had been let down at 34 minutes.  No significant   hemostasis required.  The medium Hemovac drain was placed deep.  The   extensor mechanism was then reapproximated using #1 Vicryl with the knee   in flexion.  The  remaining wound was closed with 2-0 Vicryl and running 4-0 Monocryl.   The knee was cleaned, dried, dressed sterilely using Dermabond and   Aquacel dressing.  Drain site dressed separately.  The patient was  then   brought to recovery room in stable condition, tolerating the procedure   well.   Please note that Physician Assistant, Lanney Gins, PA-C, was present for the entirety of the case, and was utilized for pre-operative positioning, peri-operative retractor management, general facilitation of the procedure.  He was also utilized for primary wound closure at the end of the case.              Madlyn Frankel Charlann Boxer, M.D.

## 2012-11-12 ENCOUNTER — Encounter (HOSPITAL_COMMUNITY): Payer: Self-pay | Admitting: Orthopedic Surgery

## 2012-11-12 DIAGNOSIS — D500 Iron deficiency anemia secondary to blood loss (chronic): Secondary | ICD-10-CM | POA: Diagnosis not present

## 2012-11-12 LAB — BASIC METABOLIC PANEL
CO2: 25 mEq/L (ref 19–32)
Calcium: 8 mg/dL — ABNORMAL LOW (ref 8.4–10.5)
Chloride: 104 mEq/L (ref 96–112)
Glucose, Bld: 119 mg/dL — ABNORMAL HIGH (ref 70–99)
Sodium: 135 mEq/L (ref 135–145)

## 2012-11-12 LAB — GLUCOSE, CAPILLARY
Glucose-Capillary: 113 mg/dL — ABNORMAL HIGH (ref 70–99)
Glucose-Capillary: 103 mg/dL — ABNORMAL HIGH (ref 70–99)

## 2012-11-12 LAB — CBC
Hemoglobin: 11.1 g/dL — ABNORMAL LOW (ref 12.0–15.0)
MCH: 26.9 pg (ref 26.0–34.0)
MCV: 82.6 fL (ref 78.0–100.0)
Platelets: 167 10*3/uL (ref 150–400)
RBC: 4.13 MIL/uL (ref 3.87–5.11)
WBC: 8.8 10*3/uL (ref 4.0–10.5)

## 2012-11-12 MED ORDER — CELECOXIB 200 MG PO CAPS: 200.0000 mg | ORAL_CAPSULE | Freq: Two times a day (BID) | ORAL | Status: DC

## 2012-11-12 MED ORDER — DSS 100 MG PO CAPS: 100.0000 mg | ORAL_CAPSULE | Freq: Two times a day (BID) | ORAL | Status: DC

## 2012-11-12 MED ORDER — POLYETHYLENE GLYCOL 3350 17 G PO PACK: 17.0000 g | PACK | Freq: Two times a day (BID) | ORAL | Status: DC

## 2012-11-12 MED ORDER — FERROUS SULFATE 325 (65 FE) MG PO TABS: 325.0000 mg | ORAL_TABLET | Freq: Three times a day (TID) | ORAL | Status: DC

## 2012-11-12 MED ORDER — ASPIRIN 325 MG PO TBEC: 325.0000 mg | DELAYED_RELEASE_TABLET | Freq: Two times a day (BID) | ORAL | Status: DC

## 2012-11-12 MED ORDER — HYDROCODONE-ACETAMINOPHEN 7.5-325 MG PO TABS: 1.0000 | ORAL_TABLET | ORAL | Status: DC | PRN

## 2012-11-12 MED ORDER — METHOCARBAMOL 500 MG PO TABS: 500.0000 mg | ORAL_TABLET | Freq: Four times a day (QID) | ORAL | Status: DC | PRN

## 2012-11-12 NOTE — Evaluation (Signed)
Occupational Therapy Evaluation Patient Details Name: Tracey Foster MRN: [id number] DOB: 1955-06-10 Today's Date: 11/12/2012 Time: 9528-4132 OT Time Calculation (min): 30 min  OT Assessment / Plan / Recommendation Clinical Impression  This 57 year old female was admitted for R TKA.  She will benefit from continued OT in acute to increase independence with toilet transfers.  She will have help for adls    OT Assessment  Patient needs continued OT Services    Follow Up Recommendations  No OT follow up    Barriers to Discharge      Equipment Recommendations  None recommended by OT    Recommendations for Other Services    Frequency  Min 2X/week    Precautions / Restrictions Precautions Precautions: Knee Required Braces or Orthoses:  (no KI) Restrictions RLE Weight Bearing: Weight bearing as tolerated   Pertinent Vitals/Pain No pain in bed/chair.  2/10 R knee with weight bearing.  Repositioned and ice applied    ADL  Lower Body Bathing: Minimal assistance Where Assessed - Lower Body Bathing: Supported sit to stand Lower Body Dressing: Minimal assistance Where Assessed - Lower Body Dressing: Supported sit to Pharmacist, hospital: Moderate assistance (for sit to stand then min A steps) Toilet Transfer Method: Stand pivot Toilet Transfer Equipment: Bedside commode Toileting - Clothing Manipulation and Hygiene: Min guard (after mod A for sit to stand) Where Assessed - Toileting Clothing Manipulation and Hygiene: Standing Transfers/Ambulation Related to ADLs: transferred bed to 3:1 to recliner.  Pt's transitions are slow and she needs mod A for sit to stand ADL Comments: Pt can complete UB adls with set up.  She can borrow a reacher:  reviewed it's uses.  Educated on tub bench vs. sponge bathing.  Pt plans to sponge bathe initially.  Daughter will assist as needed    OT Diagnosis: Generalized weakness  OT Problem List: Decreased strength;Decreased activity tolerance OT Treatment  Interventions: Self-care/ADL training;Therapeutic activities;Patient/family education;DME and/or AE instruction   OT Goals Acute Rehab OT Goals OT Goal Formulation: With patient Time For Goal Achievement: 11/19/12 Potential to Achieve Goals: Good ADL Goals Pt Will Transfer to Toilet: with min assist;Ambulation;3-in-1 (min guard for ambulating:  min A for sit to stand) ADL Goal: Toilet Transfer - Progress: Goal set today  Visit Information  Last OT Received On: 11/12/12 Assistance Needed: +1    Subjective Data  Subjective: I might be able to use the commode Patient Stated Goal: get this knee healed so I can have the L one done.  It's worse   Prior Functioning     Home Living Lives With: Daughter Available Help at Discharge: Family Bathroom Shower/Tub: Tub/shower unit Bathroom Toilet: Standard Home Adaptive Equipment: Environmental consultant - standard Prior Function Level of Independence: Independent Able to Take Stairs?: Yes Driving: Yes Communication Communication: No difficulties         Vision/Perception     Cognition  Cognition Arousal/Alertness: Awake/alert Behavior During Therapy: WFL for tasks assessed/performed Overall Cognitive Status: Within Functional Limits for tasks assessed    Extremity/Trunk Assessment Right Upper Extremity Assessment RUE ROM/Strength/Tone: [facility] for tasks assessed Left Upper Extremity Assessment LUE ROM/Strength/Tone: WFL for tasks assessed     Mobility Bed Mobility Supine to Sit: 4: Min assist;With rails;HOB flat Details for Bed Mobility Assistance: cues for technique Transfers Sit to Stand: 3: Mod assist;From bed;From chair/3-in-1;With upper extremity assist Details for Transfer Assistance: cues for technique.  transitions are slow.  More assist from bed     Exercise  Balance     End of Session OT - End of Session Activity Tolerance: Patient tolerated treatment well Patient left: in chair;with call bell/phone within reach  GO      [facility] 11/12/2012, 9:27 AM Marica Otter, OTR/L [phone] 11/12/2012

## 2012-11-12 NOTE — Care Management Note (Signed)
    Page 1 of 1   11/12/2012     3:02:33 PM   CARE MANAGEMENT NOTE 11/12/2012  Patient:  CRISOL, MUECKE   Account Number:  [id number]  Date Initiated:  11/12/2012  Documentation initiated by:  Colleen Can  Subjective/Objective Assessment:   dx rt total knee replacemnt    Pre-arranged with Genevieve Norlander for [facility] services which will start day after pt is discharged.     Action/Plan:   CM spoke with patient. Plans are for patient to return to her home where daughter will be caregiver.She already has DME. Eugenie Birks will provide HHpt services.   Anticipated DC Date:  11/12/2012   Anticipated DC Plan:  HOME W HOME HEALTH SERVICES      DC Planning Services  CM consult      [facility] Choice  HOME HEALTH   Choice offered to / List presented to:  C-1 Patient        HH arranged  HH-2 PT      [facility] agency  [facility]   Status of service:  Completed, signed off Medicare Important Message given?   (If response is "NO", the following Medicare IM given date fields will be blank) Date Medicare IM given:   Date Additional Medicare IM given:    Discharge Disposition:  HOME W HOME HEALTH SERVICES  Per UR Regulation:    If discussed at Long Length of Stay Meetings, dates discussed:    Comments:

## 2012-11-12 NOTE — Progress Notes (Signed)
Physical Therapy Treatment Patient Details Name: Tracey Foster MRN: [id number] DOB: 1956-01-12 Today's Date: 11/12/2012 Time: 7829-5621 PT Time Calculation (min): 28 min  PT Assessment / Plan / Recommendation Comments on Treatment Session  Pt having much difficulty steppeing up 1 step with nnonsurgicasl leg. will need to practice again and see how pt feels.    Follow Up Recommendations  Home health PT     Does the patient have the potential to tolerate intense rehabilitation     Barriers to Discharge        Equipment Recommendations  None recommended by PT    Recommendations for Other Services    Frequency 7X/week   Plan Discharge plan remains appropriate    Precautions / Restrictions Precautions Precautions: Knee   Pertinent Vitals/Pain BP 148/85after weak spell. RN aware.    Mobility  Transfers Sit to Stand: 4: Min guard;With upper extremity assist;From chair/3-in-1;With armrests Stand to Sit: To chair/3-in-1;With armrests;5: Supervision Details for Transfer Assistance: cues for technique.  transitions are slow.  More assist from bed Ambulation/Gait Ambulation/Gait Assistance: 4: Min guard Ambulation Distance (Feet): 30 Feet Assistive device: Rolling walker Gait Pattern: Step-to pattern Stairs: Yes Stairs Assistance: 2: Max assist Stairs Assistance Details (indicate cue type and reason): pt had much difficulty with stepping up With nonsurgical leg due to severe OA. daughter present. practiced backward x 2 and also emonstrated on placing a chair on the top of curb and sitting down, then turning around. Pt became sweaty and nauseated after first attempt. did feel a little better and was able to try again but with much difficulty.  Stair Management Technique: Backwards;With walker Number of Stairs: 1    Exercises Total Joint Exercises Ankle Circles/Pumps: AROM;10 reps;Both Quad Sets: AROM;Both;10 reps Short Arc Quad: AROM;10 reps;Right Heel Slides: AROM;10  reps;Right Straight Leg Raises: AAROM;10 reps;Right Long Arc Quad: AAROM;10 reps;Right Knee Flexion: AROM;Right;10 reps   PT Diagnosis:    PT Problem List:   PT Treatment Interventions:     PT Goals Acute Rehab PT Goals Pt will go Sit to Stand: with supervision PT Goal: Sit to Stand - Progress: Progressing toward goal Pt will Ambulate: 51 - 150 feet;with supervision;with rolling walker PT Goal: Ambulate - Progress: Progressing toward goal Pt will Go Up / Down Stairs: 1-2 stairs;with min assist;with least restrictive assistive device;with rail(s) PT Goal: Up/Down Stairs - Progress: Progressing toward goal  Visit Information  Last PT Received On: 11/12/12 Assistance Needed: +1    Subjective Data  Subjective: it depends on How I do. I just got hot anfelt nausea.   Cognition  Cognition Arousal/Alertness: Awake/alert    Balance     End of Session PT - End of Session Activity Tolerance: Patient limited by fatigue Patient left: in chair;with call bell/phone within reach;with family/visitor present Nurse Communication:  (about sweaty, weak feeling)   GP     Rada Hay 11/12/2012, 1:15 PM

## 2012-11-12 NOTE — Progress Notes (Signed)
   Subjective: 1 Day Post-Op Procedure(s) (LRB): RIGHT TOTAL KNEE ARTHROPLASTY (Right)   Patient reports pain as mild, pain well controlled. No events throughout the night. Ready to be discharged home.   Objective:   VITALS:   Filed Vitals:   11/12/12 0509  BP: 115/74  Pulse: 75  Temp: 98.1 F (36.7 C)  Resp: 16    Neurovascular intact Dorsiflexion/Plantar flexion intact Incision: dressing C/D/I No cellulitis present Compartment soft  LABS  Recent Labs  11/12/12 0425  HGB 11.1*  HCT 34.1*  WBC 8.8  PLT 167     Recent Labs  11/12/12 0425  NA 135  K 3.8  BUN 14  CREATININE 0.78  GLUCOSE 119*     Assessment/Plan: 1 Day Post-Op Procedure(s) (LRB): RIGHT TOTAL KNEE ARTHROPLASTY (Right) HV drain d/c'ed Foley cath d/c'ed Advance diet Up with therapy D/C IV fluids Discharge home with home health Follow up in 2 weeks at [facility]. Follow up with OLIN,Jazen Spraggins D in 2 weeks.  Contact information:  [facility] [address], [address] [place] [address] [phone]    Expected ABLA  Treated with iron and will observe  Morbid Obesity (BMI >40)  Estimated body mass index is 41.27 kg/(m^2) as calculated from the following:   Height as of this encounter: 5\' 5"  (1.651 m).   Weight as of this encounter: 112.492 kg (248 lb). Patient also counseled that weight may inhibit the healing process Patient counseled that losing weight will help with future health issues       Anastasio Auerbach. Jezreel Justiniano   PAC  11/12/2012, 9:05 AM

## 2012-11-12 NOTE — Progress Notes (Signed)
Physical Therapy Treatment Patient Details Name: Tracey Foster MRN: [id number] DOB: Feb 04, 1956 Today's Date: 11/12/2012 Time: 4098-1191 PT Time Calculation (min): 21 min  PT Assessment / Plan / Recommendation Comments on Treatment Session  Pt and daughter feel that Pt can get up the 1 step. Pt would like to go home.    Follow Up Recommendations  Home health PT     Does the patient have the potential to tolerate intense rehabilitation     Barriers to Discharge        Equipment Recommendations  None recommended by PT    Recommendations for Other Services    Frequency 7X/week   Plan Discharge plan remains appropriate    Precautions / Restrictions Precautions Precautions: Knee   Pertinent Vitals/Pain R knee pain with full WB.    Mobility  Transfers Sit to Stand: 4: Min guard;With upper extremity assist;From chair/3-in-1;With armrests Stand to Sit: To chair/3-in-1;With armrests;5: Supervision Details for Transfer Assistance: cues for technique.  transitions are slow.   Ambulation/Gait Ambulation/Gait Assistance: 4: Min guard Ambulation Distance (Feet): 30 Feet Assistive device: Rolling walker Gait Pattern: Step-to pattern Stairs: Yes Stairs Assistance: 3: Mod assist Stairs Assistance Details (indicate cue type and reason): Pt did much better. daughter present to assist. pt practiced x 2, not sweaty. Pt and daughter feel she can get up the step. Stair Management Technique: Backwards;With walker Number of Stairs: 1    Exercises    PT Diagnosis:    PT Problem List:   PT Treatment Interventions:     PT Goals Acute Rehab PT Goals Pt will go Sit to Stand: with supervision PT Goal: Sit to Stand - Progress: Progressing toward goal Pt will Ambulate: 51 - 150 feet;with supervision;with rolling walker PT Goal: Ambulate - Progress: Progressing toward goal Pt will Go Up / Down Stairs: 1-2 stairs;with min assist;with least restrictive assistive device;with rail(s) PT Goal:  Up/Down Stairs - Progress: Progressing toward goal  Visit Information  Last PT Received On: 11/12/12 Assistance Needed: +2    Subjective Data  Subjective: I want to try this again   Cognition  Cognition Arousal/Alertness: Awake/alert    Balance     End of Session PT - End of Session Activity Tolerance: Patient tolerated treatment well Patient left: in bed Nurse Communication:  (pt wants to go home)   GP     Rada Hay 11/12/2012, 1:56 PM

## 2012-11-19 NOTE — Discharge Summary (Signed)
Physician Discharge Summary  Patient ID: Tracey Foster MRN: [id number] DOB/AGE: 10-15-1955 57 y.o.  Admit date: 11/11/2012 Discharge date: 11/12/2012   Procedures:  Procedure(s) (LRB): RIGHT TOTAL KNEE ARTHROPLASTY (Right)  Attending Physician:  Dr. Durene Romans   Admission Diagnoses:   Right knee OA / pain  Discharge Diagnoses:  Principal Problem:   S/P right TKA Active Problems:   Expected blood loss anemia   Morbid obesity  Past Medical History  Diagnosis Date  . Hypertension   . Diabetes mellitus without complication     diet controlled no meds  . Arthritis     HPI: Tracey Foster, 57 y.o. female, has a history of pain and functional disability in the right knee due to arthritis and has failed non-surgical conservative treatments for greater than 12 weeks to includeNSAID's and/or analgesics and activity modification. Onset of symptoms was gradual, starting 5 years ago with gradually worsening course since that time. The patient noted no past surgery on the right knee(s). Patient currently rates pain in the right knee(s) at 8 out of 10 with activity. Patient has worsening of pain with activity and weight bearing, pain that interferes with activities of daily living, pain with passive range of motion, crepitus and with either extended sitting or standing. Patient has evidence of periarticular osteophytes and joint space narrowing by imaging studies. There is no active signs of infection. Risks, benefits and expectations were discussed with the patient. Patient understand the risks, benefits and expectations and wishes to proceed with surgery.   PCP: Lolita Patella, MD   Discharged Condition: good  Hospital Course:  Patient underwent the above stated procedure on 11/11/2012. Patient tolerated the procedure well and brought to the recovery room in good condition and subsequently to the floor.  POD #1 BP: 115/74 ; Pulse: 75 ; Temp: 98.1 F (36.7 C) ; Resp: 16  Pt's foley  was removed, as well as the hemovac drain removed. IV was changed to a saline lock. Patient reports pain as mild, pain well controlled. No events throughout the night. Ready to be discharged home.  Neurovascular intact, dorsiflexion/plantar flexion intact, incision: dressing C/D/I, no cellulitis present and compartment soft.   LABS  Basename  11/12/12    0425   HGB  11.1  HCT  34.1    Discharge Exam: General appearance: alert, cooperative and no distress Extremities: Homans sign is negative, no sign of DVT, no edema, redness or tenderness in the calves or thighs and no ulcers, gangrene or trophic changes  Disposition: Home with follow up in 2 weeks   Follow-up Information   Follow up with Shelda Pal, MD. Schedule an appointment as soon as possible for a visit in 2 weeks.   Contact information:   [address] [place] [address] [phone]       Discharge Orders   Future Orders Complete By Expires     Call MD / Call 911  As directed     Comments:      If you experience chest pain or shortness of breath, CALL 911 and be transported to the hospital emergency room.  If you develope a fever above 101 F, pus (white drainage) or increased drainage or redness at the wound, or calf pain, call your surgeon's office.    Change dressing  As directed     Comments:      Maintain surgical dressing for 10-14 days, then change the dressing daily with sterile 4 x 4 inch gauze  dressing and tape. Keep the area dry and clean.    Constipation Prevention  As directed     Comments:      Drink plenty of fluids.  Prune juice may be helpful.  You may use a stool softener, such as Colace (over the counter) 100 mg twice a day.  Use MiraLax (over the counter) for constipation as needed.    Diet - low sodium heart healthy  As directed     Discharge instructions  As directed     Comments:      Maintain surgical dressing for 10-14 days, then replace with gauze and tape. Keep the area dry  and clean until follow up. Follow up in 2 weeks at [facility]. Call with any questions or concerns.    Driving restrictions  As directed     Comments:      No driving for 4 weeks    Increase activity slowly as tolerated  As directed     TED hose  As directed     Comments:      Use stockings (TED hose) for 2 weeks on both leg(s).  You may remove them at night for sleeping.    Weight bearing as tolerated  As directed          Medication List    STOP taking these medications       CIPRO 500 MG tablet  Generic drug:  ciprofloxacin      TAKE these medications       aspirin 325 MG EC tablet  Take 1 tablet (325 mg total) by mouth 2 (two) times daily.     Calcium 500 MG tablet  Take 1,000 mg by mouth daily.     celecoxib 200 MG capsule  Commonly known as:  CELEBREX  Take 1 capsule (200 mg total) by mouth every 12 (twelve) hours.     cholecalciferol 1000 UNITS tablet  Commonly known as:  VITAMIN D  Take 1,000 Units by mouth every morning.     DSS 100 MG Caps  Take 100 mg by mouth 2 (two) times daily.     ezetimibe-simvastatin 10-40 MG per tablet  Commonly known as:  VYTORIN  Take 1 tablet by mouth at bedtime.     ferrous sulfate 325 (65 FE) MG tablet  Take 1 tablet (325 mg total) by mouth 3 (three) times daily after meals.     HYDROcodone-acetaminophen 7.5-325 MG per tablet  Commonly known as:  NORCO  Take 1-2 tablets by mouth every 4 (four) hours as needed for pain.     methocarbamol 500 MG tablet  Commonly known as:  ROBAXIN  Take 1 tablet (500 mg total) by mouth every 6 (six) hours as needed (muscle spasms).     polyethylene glycol packet  Commonly known as:  MIRALAX / GLYCOLAX  Take 17 g by mouth 2 (two) times daily.     quinapril-hydrochlorothiazide 20-12.5 MG per tablet  Commonly known as:  ACCURETIC  Take 1 tablet by mouth every morning.         Signed: Anastasio Auerbach. Kimbrely Buckel   PAC  11/19/2012, 1:31 PM

## 2012-11-28 ENCOUNTER — Encounter (HOSPITAL_COMMUNITY): Payer: Self-pay | Admitting: Pharmacy Technician

## 2012-12-03 ENCOUNTER — Encounter (HOSPITAL_COMMUNITY): Payer: Self-pay

## 2012-12-03 ENCOUNTER — Encounter (HOSPITAL_COMMUNITY)
Admission: RE | Admit: 2012-12-03 | Discharge: 2012-12-03 | Disposition: A | Discharge status: Home / Self Care | Payer: BC Managed Care – PPO | Source: Ambulatory Visit | Attending: Orthopedic Surgery | Admitting: Orthopedic Surgery

## 2012-12-03 DIAGNOSIS — Z0183 Encounter for blood typing: Secondary | ICD-10-CM | POA: Insufficient documentation

## 2012-12-03 DIAGNOSIS — Z01812 Encounter for preprocedural laboratory examination: Secondary | ICD-10-CM | POA: Insufficient documentation

## 2012-12-03 LAB — BASIC METABOLIC PANEL
BUN: 13 mg/dL (ref 6–23)
Calcium: 9.5 mg/dL (ref 8.4–10.5)
GFR calc Af Amer: >90 mL/min (ref >90)
GFR calc non Af Amer: >90 mL/min (ref >90)
Glucose, Bld: 92 mg/dL (ref 70–99)

## 2012-12-03 LAB — URINALYSIS, ROUTINE W REFLEX MICROSCOPIC
Bilirubin Urine: NEGATIVE
Glucose, UA: NEGATIVE mg/dL
Nitrite: NEGATIVE
Specific Gravity, Urine: 1.019 (ref 1.005–1.030)
pH: 5.5 (ref 5.0–8.0)

## 2012-12-03 LAB — URINE MICROSCOPIC-ADD ON

## 2012-12-03 LAB — CBC
MCH: 27.6 pg (ref 26.0–34.0)
MCHC: 32.3 g/dL (ref 30.0–36.0)
Platelets: 316 10*3/uL (ref 150–400)
RDW: 14 % (ref 11.5–15.5)

## 2012-12-03 LAB — SURGICAL PCR SCREEN
MRSA, PCR: NEGATIVE
Staphylococcus aureus: NEGATIVE

## 2012-12-03 LAB — PROTIME-INR
INR: 1.06 (ref 0.00–1.49)
Prothrombin Time: 13.6 seconds (ref 11.6–15.2)

## 2012-12-03 NOTE — Pre-Procedure Instructions (Signed)
12-03-12 EKG 2'14-report with chart/ CXR 6'14-Epic.

## 2012-12-03 NOTE — H&P (Signed)
TOTAL KNEE ADMISSION H&P  Patient is being admitted for left total knee arthroplasty.  Subjective:  Chief Complaint:  Left knee OA / pain.  HPI: Tracey Foster, 57 y.o. female, has a history of pain and functional disability in the left knee due to arthritis and has failed non-surgical conservative treatments for greater than 12 weeks to includeNSAID's and/or analgesics, corticosteriod injections and activity modification.  Onset of symptoms was gradual, starting 5 years ago with gradually worsening course since that time. The patient noted no past surgery on the left knee(s).  Patient currently rates pain in the left knee(s) at 8 out of 10 with activity. Patient has worsening of pain with activity and weight bearing, pain that interferes with activities of daily living, pain with passive range of motion, crepitus and joint swelling.  Patient has evidence of periarticular osteophytes and joint space narrowing by imaging studies.  There is no active signs of infection.  Risks, benefits and expectations were discussed with the patient. Patient understand the risks, benefits and expectations and wishes to proceed with surgery.   D/C Plans:   Home with HHPT   Post-op Meds:   Has all Rxs from last surgery: ASA, Robaxin, Celebrex, Iron, Colace and MiraLax   Tranexamic Acid: To be given   Decadron: Not to be given - DM   FYI: ASA post-op   Patient Active Problem List   Diagnosis Date Noted  . Expected blood loss anemia 11/12/2012  . Morbid obesity 11/12/2012  . S/P right TKA 11/11/2012   Past Medical History  Diagnosis Date  . Hypertension   . Diabetes mellitus without complication     diet controlled no meds  . Arthritis     Past Surgical History  Procedure Laterality Date  . Total knee arthroplasty Right 11/11/2012    Procedure: RIGHT TOTAL KNEE ARTHROPLASTY;  Surgeon: Shelda Pal, MD;  Location: [facility] ORS;  Service: Orthopedics;  Laterality: Right;     Allergies  Allergen Reactions   . Penicillins Hives    History  Substance Use Topics  . Smoking status: Never Smoker   . Smokeless tobacco: Never Used  . Alcohol Use: No      Review of Systems  Constitutional: Negative.   HENT: Negative.   Eyes: Negative.   Respiratory: Negative.   Cardiovascular: Negative.   Gastrointestinal: Negative.   Genitourinary: Negative.   Musculoskeletal: Positive for joint pain.  Skin: Negative.   Neurological: Negative.   Endo/Heme/Allergies: Negative.   Psychiatric/Behavioral: Negative.     Objective:  Physical Exam  Constitutional: She is oriented to person, place, and time. She appears well-developed and well-nourished.  HENT:  Head: Normocephalic and atraumatic.  Mouth/Throat: Oropharynx is clear and moist.  Eyes: Pupils are equal, round, and reactive to light.  Neck: Neck supple. No JVD present. No tracheal deviation present. No thyromegaly present.  Cardiovascular: Normal rate, regular rhythm, normal heart sounds and intact distal pulses.   Respiratory: Effort normal and breath sounds normal. No stridor. No respiratory distress. She has no wheezes.  GI: Soft. There is no tenderness. There is no guarding.  Musculoskeletal:       Left knee: She exhibits decreased range of motion, swelling and bony tenderness. She exhibits no effusion, no ecchymosis, no deformity, no laceration and no erythema. Tenderness found.  Lymphadenopathy:    She has no cervical adenopathy.  Neurological: She is alert and oriented to person, place, and time.  Skin: Skin is warm and dry.  Psychiatric: She has  a normal mood and affect.    Vital signs in last 24 hours: Temp:  [97.6 F (36.4 C)] 97.6 F (36.4 C) (07/02 1059) Pulse Rate:  [73] 73 (07/02 1059) Resp:  [18] 18 (07/02 1059) BP: (139)/(77) 139/77 mmHg (07/02 1059) Weight:  [112.265 kg (247 lb 8 oz)] 112.265 kg (247 lb 8 oz) (07/02 1059)  Labs:  Estimated body mass index is 41.27 kg/(m^2) as calculated from the following:    Height as of 11/11/12: 5\' 5"  (1.651 m).   Weight as of 11/11/12: 112.492 kg (248 lb).   Imaging Review Plain radiographs demonstrate severe degenerative joint disease of the left knee(s). The overall alignment is neutral. The bone quality appears to be good for age and reported activity level.  Assessment/Plan:  End stage arthritis, left knee   The patient history, physical examination, clinical judgment of the provider and imaging studies are consistent with end stage degenerative joint disease of the left knee(s) and total knee arthroplasty is deemed medically necessary. The treatment options including medical management, injection therapy arthroscopy and arthroplasty were discussed at length. The risks and benefits of total knee arthroplasty were presented and reviewed. The risks due to aseptic loosening, infection, stiffness, patella tracking problems, thromboembolic complications and other imponderables were discussed. The patient acknowledged the explanation, agreed to proceed with the plan and consent was signed. Patient is being admitted for inpatient treatment for surgery, pain control, PT, OT, prophylactic antibiotics, VTE prophylaxis, progressive ambulation and ADL's and discharge planning. The patient is planning to be discharged home with home health services.   Anastasio Auerbach Tejon Gracie   PAC  12/03/2012, 4:40 PM

## 2012-12-03 NOTE — Patient Instructions (Addendum)
20 BERYL HORNBERGER  12/03/2012   Your procedure is scheduled on: 7-8  -2014  Report to [facility] at     0615   AM.  Call this number if you have problems the morning of surgery: [phone]  Or Presurgical Testing [phone]([place])      Do not eat food:After Midnight.   Take these medicines the morning of surgery with A SIP OF WATER: Hydrocodone.   Do not wear jewelry, make-up or nail polish.  Do not wear lotions, powders, or perfumes. You may wear deodorant.  Do not shave 12 hours prior to first CHG shower(legs and under arms).(face and neck okay.)  Do not bring valuables to the hospital.  Contacts, dentures or bridgework,body piercing,  may not be worn into surgery.  Leave suitcase in the car. After surgery it may be brought to your room.  For patients admitted to the hospital, checkout time is 11:00 AM the day of discharge.   Patients discharged the day of surgery will not be allowed to drive home. Must have responsible person with you x 24 hours once discharged.  Name and phone number of your driver:   Special Instructions: CHG(Chlorhedine 4%-"Hibiclens","Betasept","Aplicare") Shower Use Special Wash: see special instructions.(avoid face and genitals)   Please read over the following fact sheets that you were given: MRSA Information, Blood Transfusion fact sheet, Incentive Spirometry Instruction.    Failure to follow these instructions may result in Cancellation of your surgery.   Patient signature_______________________________________________________

## 2012-12-08 NOTE — Progress Notes (Signed)
Pt notified of surgery time change to 8:45 am - instructed to arrive at 6:00 am - npo after midnight

## 2012-12-09 ENCOUNTER — Encounter (HOSPITAL_COMMUNITY): Payer: Self-pay | Admitting: Anesthesiology

## 2012-12-09 ENCOUNTER — Ambulatory Visit (HOSPITAL_COMMUNITY): Payer: BC Managed Care – PPO | Admitting: Anesthesiology

## 2012-12-09 ENCOUNTER — Encounter (HOSPITAL_COMMUNITY)
Admission: RE | Disposition: A | Discharge status: Home Health | Payer: Self-pay | Source: Ambulatory Visit | Attending: Orthopedic Surgery

## 2012-12-09 ENCOUNTER — Inpatient Hospital Stay (HOSPITAL_COMMUNITY)
Admission: RE | Admit: 2012-12-09 | Discharge: 2012-12-10 | DRG: 209 | Disposition: A | Discharge status: Home Health | Payer: BC Managed Care – PPO | Source: Ambulatory Visit | Attending: Orthopedic Surgery | Admitting: Orthopedic Surgery

## 2012-12-09 ENCOUNTER — Encounter (HOSPITAL_COMMUNITY): Payer: Self-pay | Admitting: *Deleted

## 2012-12-09 DIAGNOSIS — D500 Iron deficiency anemia secondary to blood loss (chronic): Secondary | ICD-10-CM | POA: Diagnosis present

## 2012-12-09 DIAGNOSIS — E119 Type 2 diabetes mellitus without complications: Secondary | ICD-10-CM | POA: Diagnosis present

## 2012-12-09 DIAGNOSIS — D62 Acute posthemorrhagic anemia: Secondary | ICD-10-CM | POA: Diagnosis not present

## 2012-12-09 DIAGNOSIS — Z6841 Body Mass Index (BMI) 40.0 and over, adult: Secondary | ICD-10-CM

## 2012-12-09 DIAGNOSIS — I10 Essential (primary) hypertension: Secondary | ICD-10-CM | POA: Diagnosis present

## 2012-12-09 DIAGNOSIS — Z96659 Presence of unspecified artificial knee joint: Secondary | ICD-10-CM

## 2012-12-09 DIAGNOSIS — M1710 Unilateral primary osteoarthritis, unspecified knee: Principal | ICD-10-CM | POA: Diagnosis present

## 2012-12-09 DIAGNOSIS — Z01812 Encounter for preprocedural laboratory examination: Secondary | ICD-10-CM

## 2012-12-09 HISTORY — PX: TOTAL KNEE ARTHROPLASTY: SHX125

## 2012-12-09 LAB — GLUCOSE, CAPILLARY
Glucose-Capillary: 114 mg/dL — ABNORMAL HIGH (ref 70–99)
Glucose-Capillary: 132 mg/dL — ABNORMAL HIGH (ref 70–99)

## 2012-12-09 SURGERY — ARTHROPLASTY, KNEE, TOTAL
Anesthesia: Spinal | Site: Knee | Laterality: Left | Wound class: Clean

## 2012-12-09 MED ORDER — METHOCARBAMOL 100 MG/ML IJ SOLN
500.0000 mg | Freq: Four times a day (QID) | INTRAVENOUS | Status: DC | PRN
  Filled 2012-12-09: qty 5

## 2012-12-09 MED ORDER — ALUM & MAG HYDROXIDE-SIMETH 200-200-20 MG/5ML PO SUSP: 30.0000 mL | ORAL | Status: DC | PRN

## 2012-12-09 MED ORDER — METOCLOPRAMIDE HCL 10 MG PO TABS: 5.0000 mg | ORAL_TABLET | Freq: Three times a day (TID) | ORAL | Status: DC | PRN

## 2012-12-09 MED ORDER — BUPIVACAINE LIPOSOME 1.3 % IJ SUSP
20.0000 mL | Freq: Once | INTRAMUSCULAR | Status: DC
  Filled 2012-12-09: qty 20

## 2012-12-09 MED ORDER — PHENOL 1.4 % MT LIQD: 1.0000 | OROMUCOSAL | Status: DC | PRN

## 2012-12-09 MED ORDER — ONDANSETRON HCL 4 MG/2ML IJ SOLN: 4.0000 mg | Freq: Four times a day (QID) | INTRAMUSCULAR | Status: DC | PRN

## 2012-12-09 MED ORDER — HYDROCHLOROTHIAZIDE 12.5 MG PO CAPS
12.5000 mg | ORAL_CAPSULE | Freq: Every day | ORAL | Status: DC
  Filled 2012-12-09: qty 1

## 2012-12-09 MED ORDER — METOCLOPRAMIDE HCL 5 MG/ML IJ SOLN: 5.0000 mg | Freq: Three times a day (TID) | INTRAMUSCULAR | Status: DC | PRN

## 2012-12-09 MED ORDER — LACTATED RINGERS IV SOLN: INTRAVENOUS | Status: DC

## 2012-12-09 MED ORDER — HYDROMORPHONE HCL PF 1 MG/ML IJ SOLN: 0.5000 mg | INTRAMUSCULAR | Status: DC | PRN

## 2012-12-09 MED ORDER — HYDROCODONE-ACETAMINOPHEN 7.5-325 MG PO TABS
1.0000 | ORAL_TABLET | ORAL | Status: DC
  Administered 2012-12-09 (×2): 2 via ORAL
  Administered 2012-12-10: 1 via ORAL
  Administered 2012-12-10 (×3): 2 via ORAL
  Filled 2012-12-09 (×6): qty 2

## 2012-12-09 MED ORDER — MEPERIDINE HCL 50 MG/ML IJ SOLN: 6.2500 mg | INTRAMUSCULAR | Status: DC | PRN

## 2012-12-09 MED ORDER — DOCUSATE SODIUM 100 MG PO CAPS
100.0000 mg | ORAL_CAPSULE | Freq: Two times a day (BID) | ORAL | Status: DC
  Administered 2012-12-09 – 2012-12-10 (×2): 100 mg via ORAL

## 2012-12-09 MED ORDER — BISACODYL 10 MG RE SUPP: 10.0000 mg | Freq: Every day | RECTAL | Status: DC | PRN

## 2012-12-09 MED ORDER — ONDANSETRON HCL 4 MG PO TABS: 4.0000 mg | ORAL_TABLET | Freq: Four times a day (QID) | ORAL | Status: DC | PRN

## 2012-12-09 MED ORDER — BUPIVACAINE IN DEXTROSE 0.75-8.25 % IT SOLN
INTRATHECAL | Status: DC | PRN
  Administered 2012-12-09: 1.6 mL via INTRATHECAL

## 2012-12-09 MED ORDER — CELECOXIB 200 MG PO CAPS
200.0000 mg | ORAL_CAPSULE | Freq: Two times a day (BID) | ORAL | Status: DC
  Administered 2012-12-09 – 2012-12-10 (×2): 200 mg via ORAL
  Filled 2012-12-09 (×3): qty 1

## 2012-12-09 MED ORDER — CLINDAMYCIN PHOSPHATE 900 MG/50ML IV SOLN
INTRAVENOUS | Status: AC
  Filled 2012-12-09: qty 50

## 2012-12-09 MED ORDER — PROPOFOL INFUSION 10 MG/ML OPTIME
INTRAVENOUS | Status: DC | PRN
  Administered 2012-12-09: 75 ug/kg/min via INTRAVENOUS

## 2012-12-09 MED ORDER — QUINAPRIL-HYDROCHLOROTHIAZIDE 20-12.5 MG PO TABS: 1.0000 | ORAL_TABLET | Freq: Every morning | ORAL | Status: DC

## 2012-12-09 MED ORDER — LACTATED RINGERS IV SOLN
INTRAVENOUS | Status: DC
  Administered 2012-12-09: 1000 mL via INTRAVENOUS
  Administered 2012-12-09 (×2): via INTRAVENOUS

## 2012-12-09 MED ORDER — CHLORHEXIDINE GLUCONATE 4 % EX LIQD
60.0000 mL | Freq: Once | CUTANEOUS | Status: DC
  Filled 2012-12-09: qty 60

## 2012-12-09 MED ORDER — STERILE WATER FOR IRRIGATION IR SOLN
Status: DC | PRN
  Administered 2012-12-09 (×2): 1500 mL

## 2012-12-09 MED ORDER — EZETIMIBE-SIMVASTATIN 10-40 MG PO TABS
1.0000 | ORAL_TABLET | Freq: Every day | ORAL | Status: DC
  Administered 2012-12-09: 1 via ORAL
  Filled 2012-12-09 (×2): qty 1

## 2012-12-09 MED ORDER — MIDAZOLAM HCL 5 MG/5ML IJ SOLN
INTRAMUSCULAR | Status: DC | PRN
  Administered 2012-12-09 (×4): 1 mg via INTRAVENOUS

## 2012-12-09 MED ORDER — DEXAMETHASONE SODIUM PHOSPHATE 10 MG/ML IJ SOLN
INTRAMUSCULAR | Status: DC | PRN
  Administered 2012-12-09: 10 mg via INTRAVENOUS

## 2012-12-09 MED ORDER — FERROUS SULFATE 325 (65 FE) MG PO TABS
325.0000 mg | ORAL_TABLET | Freq: Three times a day (TID) | ORAL | Status: DC
  Administered 2012-12-09 – 2012-12-10 (×3): 325 mg via ORAL
  Filled 2012-12-09 (×5): qty 1

## 2012-12-09 MED ORDER — INSULIN ASPART 100 UNIT/ML ~~LOC~~ SOLN
0.0000 [IU] | Freq: Three times a day (TID) | SUBCUTANEOUS | Status: DC
  Administered 2012-12-09 – 2012-12-10 (×2): 2 [IU] via SUBCUTANEOUS

## 2012-12-09 MED ORDER — PROMETHAZINE HCL 25 MG/ML IJ SOLN: 6.2500 mg | INTRAMUSCULAR | Status: DC | PRN

## 2012-12-09 MED ORDER — SODIUM CHLORIDE 0.9 % IV SOLN
INTRAVENOUS | Status: DC
  Administered 2012-12-09 – 2012-12-10 (×2): via INTRAVENOUS
  Filled 2012-12-09 (×5): qty 1000

## 2012-12-09 MED ORDER — KETOROLAC TROMETHAMINE 30 MG/ML IJ SOLN
INTRAMUSCULAR | Status: DC | PRN
  Administered 2012-12-09: 30 mg

## 2012-12-09 MED ORDER — ONDANSETRON HCL 4 MG/2ML IJ SOLN
INTRAMUSCULAR | Status: DC | PRN
  Administered 2012-12-09: 4 mg via INTRAVENOUS

## 2012-12-09 MED ORDER — ASPIRIN EC 325 MG PO TBEC
325.0000 mg | DELAYED_RELEASE_TABLET | Freq: Two times a day (BID) | ORAL | Status: DC
  Administered 2012-12-10: 325 mg via ORAL
  Filled 2012-12-09 (×3): qty 1

## 2012-12-09 MED ORDER — CLINDAMYCIN PHOSPHATE 900 MG/50ML IV SOLN
900.0000 mg | INTRAVENOUS | Status: AC
  Administered 2012-12-09: 900 mg via INTRAVENOUS
  Filled 2012-12-09: qty 50

## 2012-12-09 MED ORDER — 0.9 % SODIUM CHLORIDE (POUR BTL) OPTIME
TOPICAL | Status: DC | PRN
  Administered 2012-12-09: 1000 mL

## 2012-12-09 MED ORDER — METHOCARBAMOL 500 MG PO TABS
500.0000 mg | ORAL_TABLET | Freq: Four times a day (QID) | ORAL | Status: DC | PRN
  Administered 2012-12-09 – 2012-12-10 (×3): 500 mg via ORAL
  Filled 2012-12-09 (×4): qty 1

## 2012-12-09 MED ORDER — FENTANYL CITRATE 0.05 MG/ML IJ SOLN
INTRAMUSCULAR | Status: DC | PRN
  Administered 2012-12-09: 100 ug via INTRAVENOUS

## 2012-12-09 MED ORDER — CLINDAMYCIN PHOSPHATE 600 MG/50ML IV SOLN: 600.0000 mg | Freq: Four times a day (QID) | INTRAVENOUS | Status: DC

## 2012-12-09 MED ORDER — POLYETHYLENE GLYCOL 3350 17 G PO PACK
17.0000 g | PACK | Freq: Two times a day (BID) | ORAL | Status: DC
  Administered 2012-12-09 – 2012-12-10 (×2): 17 g via ORAL

## 2012-12-09 MED ORDER — BUPIVACAINE-EPINEPHRINE PF 0.25-1:200000 % IJ SOLN
INTRAMUSCULAR | Status: AC
  Filled 2012-12-09: qty 30

## 2012-12-09 MED ORDER — DIPHENHYDRAMINE HCL 25 MG PO CAPS: 25.0000 mg | ORAL_CAPSULE | Freq: Four times a day (QID) | ORAL | Status: DC | PRN

## 2012-12-09 MED ORDER — SODIUM CHLORIDE 0.9 % IJ SOLN
INTRAMUSCULAR | Status: DC | PRN
  Administered 2012-12-09: 12:00:00

## 2012-12-09 MED ORDER — BUPIVACAINE-EPINEPHRINE 0.25% -1:200000 IJ SOLN
INTRAMUSCULAR | Status: DC | PRN
  Administered 2012-12-09: 25 mL

## 2012-12-09 MED ORDER — ZOLPIDEM TARTRATE 5 MG PO TABS: 5.0000 mg | ORAL_TABLET | Freq: Every evening | ORAL | Status: DC | PRN

## 2012-12-09 MED ORDER — HYDROMORPHONE HCL PF 1 MG/ML IJ SOLN: 0.2500 mg | INTRAMUSCULAR | Status: DC | PRN

## 2012-12-09 MED ORDER — FLEET ENEMA 7-19 GM/118ML RE ENEM: 1.0000 | ENEMA | Freq: Once | RECTAL | Status: AC | PRN

## 2012-12-09 MED ORDER — KETOROLAC TROMETHAMINE 30 MG/ML IJ SOLN
INTRAMUSCULAR | Status: AC
  Filled 2012-12-09: qty 1

## 2012-12-09 MED ORDER — LISINOPRIL 20 MG PO TABS
20.0000 mg | ORAL_TABLET | Freq: Every day | ORAL | Status: DC
  Filled 2012-12-09: qty 1

## 2012-12-09 MED ORDER — MENTHOL 3 MG MT LOZG: 1.0000 | LOZENGE | OROMUCOSAL | Status: DC | PRN

## 2012-12-09 SURGICAL SUPPLY — 58 items
BAG ZIPLOCK 12X15 (MISCELLANEOUS) ×2 IMPLANT
BANDAGE ELASTIC 6 VELCRO ST LF (GAUZE/BANDAGES/DRESSINGS) ×2 IMPLANT
BANDAGE ESMARK 6X9 LF (GAUZE/BANDAGES/DRESSINGS) ×1 IMPLANT
BLADE SAW SGTL 13.0X1.19X90.0M (BLADE) ×2 IMPLANT
BNDG ESMARK 6X9 LF (GAUZE/BANDAGES/DRESSINGS) ×2
BONE CEMENT GENTAMICIN (Cement) ×4 IMPLANT
BOWL SMART MIX CTS (DISPOSABLE) ×2 IMPLANT
CAPT RP KNEE ×2 IMPLANT
CEMENT BONE GENTAMICIN 40 (Cement) ×2 IMPLANT
CLOTH BEACON ORANGE TIMEOUT ST (SAFETY) ×2 IMPLANT
CUFF TOURN SGL QUICK 34 (TOURNIQUET CUFF) ×1
CUFF TRNQT CYL 34X4X40X1 (TOURNIQUET CUFF) ×1 IMPLANT
DECANTER SPIKE VIAL GLASS SM (MISCELLANEOUS) ×2 IMPLANT
DERMABOND ADVANCED (GAUZE/BANDAGES/DRESSINGS) ×1
DERMABOND ADVANCED .7 DNX12 (GAUZE/BANDAGES/DRESSINGS) ×1 IMPLANT
DRAPE EXTREMITY T 121X128X90 (DRAPE) ×2 IMPLANT
DRAPE POUCH INSTRU U-SHP 10X18 (DRAPES) ×2 IMPLANT
DRAPE U-SHAPE 47X51 STRL (DRAPES) ×2 IMPLANT
DRSG AQUACEL AG ADV 3.5X 6 (GAUZE/BANDAGES/DRESSINGS) ×2 IMPLANT
DRSG AQUACEL AG ADV 3.5X10 (GAUZE/BANDAGES/DRESSINGS) ×2 IMPLANT
DRSG TEGADERM 4X4.75 (GAUZE/BANDAGES/DRESSINGS) ×2 IMPLANT
DURAPREP 26ML APPLICATOR (WOUND CARE) ×2 IMPLANT
ELECT REM PT RETURN 9FT ADLT (ELECTROSURGICAL) ×2
ELECTRODE REM PT RTRN 9FT ADLT (ELECTROSURGICAL) ×1 IMPLANT
EVACUATOR 1/8 PVC DRAIN (DRAIN) ×2 IMPLANT
FACESHIELD LNG OPTICON STERILE (SAFETY) ×10 IMPLANT
GAUZE SPONGE 2X2 8PLY STRL LF (GAUZE/BANDAGES/DRESSINGS) ×1 IMPLANT
GLOVE BIOGEL PI IND STRL 7.5 (GLOVE) ×1 IMPLANT
GLOVE BIOGEL PI IND STRL 8 (GLOVE) ×1 IMPLANT
GLOVE BIOGEL PI INDICATOR 7.5 (GLOVE) ×1
GLOVE BIOGEL PI INDICATOR 8 (GLOVE) ×1
GLOVE ECLIPSE 8.0 STRL XLNG CF (GLOVE) ×2 IMPLANT
GLOVE ORTHO TXT STRL SZ7.5 (GLOVE) ×4 IMPLANT
GOWN BRE IMP PREV XXLGXLNG (GOWN DISPOSABLE) ×2 IMPLANT
GOWN STRL NON-REIN LRG LVL3 (GOWN DISPOSABLE) ×2 IMPLANT
HANDPIECE INTERPULSE COAX TIP (DISPOSABLE) ×1
KIT BASIN OR (CUSTOM PROCEDURE TRAY) ×2 IMPLANT
MANIFOLD NEPTUNE II (INSTRUMENTS) ×2 IMPLANT
NDL SAFETY ECLIPSE 18X1.5 (NEEDLE) ×1 IMPLANT
NEEDLE HYPO 18GX1.5 SHARP (NEEDLE) ×1
NS IRRIG 1000ML POUR BTL (IV SOLUTION) ×4 IMPLANT
PACK ICE MAXI GEL EZY WRAP (MISCELLANEOUS) ×2 IMPLANT
PACK TOTAL JOINT (CUSTOM PROCEDURE TRAY) ×2 IMPLANT
POSITIONER SURGICAL ARM (MISCELLANEOUS) ×2 IMPLANT
SET HNDPC FAN SPRY TIP SCT (DISPOSABLE) ×1 IMPLANT
SET PAD KNEE POSITIONER (MISCELLANEOUS) ×2 IMPLANT
SPONGE GAUZE 2X2 STER 10/PKG (GAUZE/BANDAGES/DRESSINGS) ×1
SUCTION FRAZIER 12FR DISP (SUCTIONS) ×2 IMPLANT
SUT MNCRL AB 4-0 PS2 18 (SUTURE) ×2 IMPLANT
SUT VIC AB 1 CT1 36 (SUTURE) ×2 IMPLANT
SUT VIC AB 2-0 CT1 27 (SUTURE) ×3
SUT VIC AB 2-0 CT1 TAPERPNT 27 (SUTURE) ×3 IMPLANT
SUT VLOC 180 0 24IN GS25 (SUTURE) ×2 IMPLANT
SYR 50ML LL SCALE MARK (SYRINGE) ×2 IMPLANT
TOWEL OR 17X26 10 PK STRL BLUE (TOWEL DISPOSABLE) ×4 IMPLANT
TRAY FOLEY CATH 14FRSI W/METER (CATHETERS) ×2 IMPLANT
WATER STERILE IRR 1500ML POUR (IV SOLUTION) ×2 IMPLANT
WRAP KNEE MAXI GEL POST OP (GAUZE/BANDAGES/DRESSINGS) ×2 IMPLANT

## 2012-12-09 NOTE — Op Note (Signed)
NAME:  Tracey Foster                      MEDICAL RECORD NO.:  [id number]                             FACILITY:  [facility]      PHYSICIAN:  Madlyn Frankel. Charlann Boxer, M.D.  DATE OF BIRTH:  10-Jun-1955      DATE OF PROCEDURE:  12/09/2012                                     OPERATIVE REPORT         PREOPERATIVE DIAGNOSIS:  Left knee osteoarthritis.      POSTOPERATIVE DIAGNOSIS:  Left knee osteoarthritis with history of recent right total knee replacement     FINDINGS:  The patient was noted to have complete loss of cartilage and   bone-on-bone arthritis with associated osteophytes in all three compartments of   the knee with a significant synovitis and associated effusion and osteophytes.      PROCEDURE:  Left total knee replacement.      COMPONENTS USED:  DePuy rotating platform posterior stabilized knee   system, a size 3 femur, 3 tibia, 10 mm PS insert, and 38 patellar   button.      SURGEON:  Madlyn Frankel. Charlann Boxer, M.D.      ASSISTANT:  Sunday Spillers, PA-C.      ANESTHESIA:  Spinal.      SPECIMENS:  None.      COMPLICATION:  None.      DRAINS:  One Hemovac.  EBL: <150cc      TOURNIQUET TIME:   Total Tourniquet Time Documented: Thigh (Left) - 35 minutes Total: Thigh (Left) - 35 minutes  .      The patient was stable to the recovery room.      INDICATION FOR PROCEDURE:  Tracey Foster is a 57 y.o. female patient of   mine.  The patient had been seen, evaluated, and treated conservatively in the   office with medication, activity modification, and injections.  The patient had   radiographic changes of bone-on-bone arthritis with endplate sclerosis and osteophytes noted.      The patient failed conservative measures including medication, injections, and activity modification, and at this point was ready for more definitive measures.   Based on the radiographic changes and failed conservative measures, the patient   decided to proceed with total knee replacement.  Risks of infection,   DVT, component failure, need for revision surgery, postop course, and   expectations were all   discussed and reviewed.  Consent was obtained for benefit of pain   relief.      PROCEDURE IN DETAIL:  The patient was brought to the operative theater.   Once adequate anesthesia, preoperative antibiotics, 2 gm of Ancef administered, the patient was positioned supine with the left thigh tourniquet placed.  The  left lower extremity was prepped and draped in sterile fashion.  A time-   out was performed identifying the patient, planned procedure, and   extremity.      The left lower extremity was placed in the [facility] leg holder.  The leg was   exsanguinated, tourniquet elevated to 250 mmHg.  A midline incision was   made followed by median parapatellar arthrotomy.  Following  initial   exposure, attention was first directed to the patella.  Precut   measurement was noted to be 25-26 mm.  I resected down to 14-15 mm and used a   38 patellar button to restore patellar height as well as cover the cut   surface.      The lug holes were drilled and a metal shim was placed to protect the   patella from retractors and saw blades.      At this point, attention was now directed to the femur.  The femoral   canal was opened with a drill, irrigated to try to prevent fat emboli.  An   intramedullary rod was passed at 3 degrees valgus, 10 mm of bone was   resected off the distal femur.  Following this resection, the tibia was   subluxated anteriorly.  Using the extramedullary guide, 10 mm of bone was resected off   the proximal lateral tibia.  We confirmed the gap would be   stable medially and laterally with a 10 mm insert as well as confirmed   the cut was perpendicular in the coronal plane, checking with an alignment rod.      Once this was done, I sized the femur to be a size 3 in the anterior-   posterior dimension, chose a standard component based on medial and   lateral dimension.  The size 3  rotation block was then pinned in   position anterior referenced using the C-clamp to set rotation.  The   anterior, posterior, and  chamfer cuts were made without difficulty nor   notching making certain that I was along the anterior cortex to help   with flexion gap stability.      The final box cut was made off the lateral aspect of distal femur.      At this point, the tibia was sized to be a size 3, particularly after removing large osteophytes off the medial tibia.  The size 3 tray was   then pinned in position through the medial third of the tubercle,   drilled, and keel punched.  Trial reduction was now carried with a 3 femur,  3 tibia, a 10 mm insert, and the 38 patella botton.  The knee was brought to   extension, full extension with good flexion stability with the patella   tracking through the trochlea without application of pressure.  Given   all these findings, the trial components removed.  Final components were   opened and cement was mixed.  The knee was irrigated with normal saline   solution and pulse lavage.  The synovial lining was   then injected with 0.25% Marcaine with epinephrine and 1 cc of Toradol,   total of 61 cc.      The knee was irrigated.  Final implants were then cemented onto clean and   dried cut surfaces of bone with the knee brought to extension with a 10 mm trial insert.      Once the cement had fully cured, the excess cement was removed   throughout the knee.  I confirmed I was satisfied with the range of   motion and stability, and the final 10 mm PS insert was chosen.  It was   placed into the knee.      The tourniquet had been let down at 35 minutes.  No significant   hemostasis required.  The medium Hemovac drain was placed deep.  The   extensor mechanism  was then reapproximated using #1 Vicryl with the knee   in flexion.  The   remaining wound was closed with 2-0 Vicryl and running 4-0 Monocryl.   The knee was cleaned, dried, dressed  sterilely using Dermabond and   Aquacel dressing.  Drain site dressed separately.  The patient was then   brought to recovery room in stable condition, tolerating the procedure   well.   Please note that Physician Assistant, Lanney Gins, was present for the entirety of the case, and was utilized for pre-operative positioning, peri-operative retractor management, general facilitation of the procedure.  He was also utilized for primary wound closure at the end of the case.              Madlyn Frankel Charlann Boxer, M.D.

## 2012-12-09 NOTE — Evaluation (Signed)
Physical Therapy Evaluation Patient Details Name: Tracey Foster MRN: [id number] DOB: Jun 26, 1955 Today's Date: 12/09/2012 Time: 4098-1191 PT Time Calculation (min): 23 min  PT Assessment / Plan / Recommendation History of Present Illness  57 y.o. female with L TKA  Clinical Impression  *Pt had R TKA last month with good recovery. Today pt walked 51' with RW with min/guard assist. Excellent progress expected. She would benefit from acute PT to maximize safety and independence with mobility. **    PT Assessment  Patient needs continued PT services    Follow Up Recommendations  Home health PT    Does the patient have the potential to tolerate intense rehabilitation      Barriers to Discharge        Equipment Recommendations  None recommended by PT    Recommendations for Other Services     Frequency 7X/week    Precautions / Restrictions Precautions Precautions: None Restrictions Weight Bearing Restrictions: No   Pertinent Vitals/Pain *1/10 L knee premedicated**      Mobility  Bed Mobility Bed Mobility: Supine to Sit Supine to Sit: 4: Min assist Details for Bed Mobility Assistance: min A LLE Transfers Transfers: Sit to Stand;Stand to Sit Sit to Stand: 4: Min assist;From bed;With upper extremity assist Stand to Sit: 4: Min assist;To chair/3-in-1;With upper extremity assist Details for Transfer Assistance: min A to rise, VCs hand placement Ambulation/Gait Ambulation/Gait Assistance: 4: Min guard Ambulation Distance (Feet): 90 Feet Assistive device: Rolling walker Gait Pattern: Step-to pattern General Gait Details: no LOB    Exercises Total Joint Exercises Ankle Circles/Pumps: AROM;Both;10 reps;Supine Quad Sets: AROM;Both;10 reps;Supine Heel Slides: AAROM;Left;10 reps;Supine   PT Diagnosis: Difficulty walking;Acute pain  PT Problem List: Decreased strength;Decreased range of motion;Decreased activity tolerance;Pain;Decreased mobility PT Treatment Interventions:  DME instruction;Gait training;Functional mobility training;Stair training;Therapeutic exercise;Therapeutic activities;Patient/family education     PT Goals(Current goals can be found in the care plan section) Acute Rehab PT Goals Patient Stated Goal: to be able to walk better, to be able to travel (wants to go to all 50 states) PT Goal Formulation: With patient/family Time For Goal Achievement: 12/23/12 Potential to Achieve Goals: Good  Visit Information  Last PT Received On: 12/09/12 Assistance Needed: +1 History of Present Illness: 57 y.o. female with L TKA       Prior Functioning  Home Living Family/patient expects to be discharged to:: Private residence Living Arrangements: Children (daughter) Available Help at Discharge: Family Type of Home: House Home Access: Stairs to enter [employer] of Steps: 3 total; last 2 step have rails Home Layout: Two level;Able to live on main level with bedroom/bathroom Alternate Level Stairs-Number of Steps: 14 Home Equipment: Walker - standard;Bedside commode;Cane - single point Additional Comments: riser Prior Function Level of Independence: Independent with assistive device(s) Comments: independent with cane Communication Communication: No difficulties    Cognition  Cognition Arousal/Alertness: Awake/alert Behavior During Therapy: WFL for tasks assessed/performed Overall Cognitive Status: Within Functional Limits for tasks assessed    Extremity/Trunk Assessment Lower Extremity Assessment Lower Extremity Assessment: LLE deficits/detail LLE Deficits / Details: knee flexion AAROM 30*, SLR 3/5 Cervical / Trunk Assessment Cervical / Trunk Assessment: Normal   Balance    End of Session PT - End of Session Equipment Utilized During Treatment: Gait belt Activity Tolerance: Patient tolerated treatment well Patient left: in chair;with call bell/phone within reach;with family/visitor present Nurse Communication: Mobility status   GP     Ralene Bathe Kistler 12/09/2012, 5:31 PM [phone]

## 2012-12-09 NOTE — Transfer of Care (Signed)
Immediate Anesthesia Transfer of Care Note  Patient: Tracey Foster  Procedure(s) Performed: Procedure(s): LEFT TOTAL KNEE ARTHROPLASTY (Left)  Patient Location: PACU  Anesthesia Type:Regional  Level of Consciousness: awake, alert  and oriented  Airway & Oxygen Therapy: Patient Spontanous Breathing and Patient connected to face mask oxygen  Post-op Assessment: Report given to PACU RN and Post -op Vital signs reviewed and stable  Post vital signs: Reviewed and stable  Complications: No apparent anesthesia complications

## 2012-12-09 NOTE — Anesthesia Preprocedure Evaluation (Signed)
Anesthesia Evaluation  Patient identified by MRN, date of birth, ID band Patient awake    Reviewed: Allergy & Precautions, H&P , NPO status , Patient's Chart, lab work & pertinent test results  Airway Mallampati: II TM Distance: >3 FB Neck ROM: Full    Dental no notable dental hx.    Pulmonary neg pulmonary ROS,  breath sounds clear to auscultation  Pulmonary exam normal       Cardiovascular hypertension, negative cardio ROS  Rhythm:Regular Rate:Normal     Neuro/Psych negative neurological ROS  negative psych ROS   GI/Hepatic negative GI ROS, Neg liver ROS,   Endo/Other  negative endocrine ROS  Renal/GU negative Renal ROS  negative genitourinary   Musculoskeletal negative musculoskeletal ROS (+)   Abdominal   Peds negative pediatric ROS (+)  Hematology negative hematology ROS (+)   Anesthesia Other Findings   Reproductive/Obstetrics negative OB ROS                           Anesthesia Physical  Anesthesia Plan  ASA: II  Anesthesia Plan: Spinal   Post-op Pain Management:    Induction: Intravenous  Airway Management Planned:   Additional Equipment:   Intra-op Plan:   Post-operative Plan:   Informed Consent: I have reviewed the patients History and Physical, chart, labs and discussed the procedure including the risks, benefits and alternatives for the proposed anesthesia with the patient or authorized representative who has indicated his/her understanding and acceptance.   Dental advisory given  Plan Discussed with: CRNA  Anesthesia Plan Comments: (No change since last SAB)        Anesthesia Quick Evaluation

## 2012-12-09 NOTE — Anesthesia Procedure Notes (Signed)
Spinal  Patient location during procedure: OR End time: 12/09/2012 10:12 AM Staffing CRNA/Resident: Enriqueta Shutter Performed by: anesthesiologist and resident/CRNA  Preanesthetic Checklist Completed: patient identified, site marked, surgical consent, pre-op evaluation, timeout performed, IV checked, risks and benefits discussed and monitors and equipment checked Spinal Block Patient position: sitting Prep: Betadine Patient monitoring: heart rate, continuous pulse ox and blood pressure Approach: midline Location: L3-4 Injection technique: single-shot Needle Needle type: Sprotte  Needle gauge: 24 G Needle length: 9 cm Assessment Sensory level: T6 Additional Notes Expiration date of kit checked and confirmed. Patient tolerated procedure well, without complications.

## 2012-12-09 NOTE — Anesthesia Postprocedure Evaluation (Signed)
  Anesthesia Post-op Note  Patient: Tracey Foster  Procedure(s) Performed: Procedure(s) (LRB): LEFT TOTAL KNEE ARTHROPLASTY (Left)  Patient Location: PACU  Anesthesia Type: Spinal  Level of Consciousness: awake and alert   Airway and Oxygen Therapy: Patient Spontanous Breathing  Post-op Pain: mild  Post-op Assessment: Post-op Vital signs reviewed, Patient's Cardiovascular Status Stable, Respiratory Function Stable, Patent Airway and No signs of Nausea or vomiting  Last Vitals:  Filed Vitals:   12/09/12 1230  BP: 119/63  Pulse: 72  Temp:   Resp: 13    Post-op Vital Signs: stable   Complications: No apparent anesthesia complications

## 2012-12-09 NOTE — Interval H&P Note (Signed)
History and Physical Interval Note:  12/09/2012 6:50 AM  Tracey Foster  has presented today for surgery, with the diagnosis of LEFT KNEE OA  The various methods of treatment have been discussed with the patient and family. After consideration of risks, benefits and other options for treatment, the patient has consented to  Procedure(s): LEFT TOTAL KNEE ARTHROPLASTY (Left) as a surgical intervention .  The patient's history has been reviewed, patient examined, no change in status, stable for surgery.  I have reviewed the patient's chart and labs.  Questions were answered to the patient's satisfaction.     Shelda Pal

## 2012-12-10 ENCOUNTER — Encounter (HOSPITAL_COMMUNITY): Payer: Self-pay | Admitting: Orthopedic Surgery

## 2012-12-10 LAB — CBC
HCT: 26.7 % — ABNORMAL LOW (ref 36.0–46.0)
MCH: 28.6 pg (ref 26.0–34.0)
MCHC: 34.5 g/dL (ref 30.0–36.0)
MCV: 82.9 fL (ref 78.0–100.0)
Platelets: 198 10*3/uL (ref 150–400)
RDW: 13.5 % (ref 11.5–15.5)

## 2012-12-10 LAB — GLUCOSE, CAPILLARY: Glucose-Capillary: 144 mg/dL — ABNORMAL HIGH (ref 70–99)

## 2012-12-10 LAB — BASIC METABOLIC PANEL
BUN: 16 mg/dL (ref 6–23)
Calcium: 8.4 mg/dL (ref 8.4–10.5)
Creatinine, Ser: 0.74 mg/dL (ref 0.50–1.10)
GFR calc Af Amer: >90 mL/min (ref >90)
GFR calc non Af Amer: >90 mL/min (ref >90)

## 2012-12-10 MED ORDER — HYDROCODONE-ACETAMINOPHEN 7.5-325 MG PO TABS: 1.0000 | ORAL_TABLET | ORAL | Status: DC

## 2012-12-10 MED ORDER — CLINDAMYCIN PHOSPHATE 600 MG/50ML IV SOLN
600.0000 mg | Freq: Four times a day (QID) | INTRAVENOUS | Status: DC
  Administered 2012-12-10 (×2): 600 mg via INTRAVENOUS
  Filled 2012-12-10 (×2): qty 50

## 2012-12-10 MED ORDER — POLYETHYLENE GLYCOL 3350 17 G PO PACK: 17.0000 g | PACK | Freq: Two times a day (BID) | ORAL | Status: DC

## 2012-12-10 NOTE — Progress Notes (Signed)
Physical Therapy Treatment Patient Details Name: Tracey Foster MRN: [id number] DOB: 12/18/1955 Today's Date: 12/10/2012 Time: 4098-1191 PT Time Calculation (min): 31 min  PT Assessment / Plan / Recommendation  PT Comments   Pm session practiced going up one step backward with RW and daughter present.  Pt plans to D/C to home today.   Follow Up Recommendations  Home health PT     Does the patient have the potential to tolerate intense rehabilitation     Barriers to Discharge        Equipment Recommendations  None recommended by PT    Recommendations for Other Services    Frequency 7X/week   Progress towards PT Goals    Plan      Precautions / Restrictions Precautions Precautions: Knee Restrictions Weight Bearing Restrictions: No Other Position/Activity Restrictions: WBAT    Pertinent Vitals/Pain C/o 3/10 knee pain    Mobility  Bed Mobility Bed Mobility: Not assessed Details for Bed Mobility Assistance: Pt OOB in recliner Transfers Transfers: Sit to Stand;Stand to Sit Sit to Stand: 5: Supervision;4: Min guard;From chair/3-in-1 Stand to Sit: 5: Supervision;4: Min guard;To chair/3-in-1 Details for Transfer Assistance: increased time/good hand placement Ambulation/Gait Ambulation/Gait Assistance: 5: Supervision;4: Min guard Ambulation Distance (Feet): 25 Feet Ambulation/Gait Assistance Details: increased time and <25% VC's on safety with turns and backward gait completion Gait Pattern: Step-to pattern;Decreased stance time - left;Trunk flexed Gait velocity: decreased Stairs: Yes Stairs Assistance: 4: Min guard;5: Supervision Stairs Assistance Details (indicate cue type and reason): with daughter present Stair Management Technique: No rails;Backwards;With walker Number of Stairs: 1    PT Goals (current goals can now be found in the care plan section)      progressing    Visit Information  Last PT Received On: 12/10/12 Assistance Needed: +1 History of Present  Illness: 57 y.o. female with L TKA    Subjective Data      Cognition       Balance     End of Session PT - End of Session Equipment Utilized During Treatment: Gait belt Activity Tolerance: Patient tolerated treatment well Patient left: in chair;with call bell/phone within reach;with family/visitor present   Felecia Shelling  PTA [facility]  Acute  Rehab Pager      [phone]

## 2012-12-10 NOTE — Care Management Note (Signed)
    Page 1 of 1   12/10/2012     3:18:54 PM   CARE MANAGEMENT NOTE 12/10/2012  Patient:  Tracey Foster, Tracey Foster   Account Number:  [id number]  Date Initiated:  12/10/2012  Documentation initiated by:  Colleen Can  Subjective/Objective Assessment:   DX LEFT KNEE OSTEOARTHRITIS; TOTAL KNEE REPLACEMENT.    PRE-ARRANGED WITH [facility] PRIOR TO ADMISSION; SERVICES WILL START WITHIN 48HRS OF D/C.     Action/Plan:   HOME UPON DISCHARGE WITH [facility] HH SERVICES IN PLACE.   Anticipated DC Date:  12/10/2012   Anticipated DC Plan:  HOME W HOME HEALTH SERVICES      DC Planning Services  CM consult      Choice offered to / List presented to:          [facility] arranged  HH-2 PT      [facility] agency  [facility]   Status of service:  Completed, signed off Medicare Important Message given?   (If response is "NO", the following Medicare IM given date fields will be blank) Date Medicare IM given:   Date Additional Medicare IM given:    Discharge Disposition:  HOME W HOME HEALTH SERVICES  Per UR Regulation:  Reviewed for med. necessity/level of care/duration of stay  If discussed at Long Length of Stay Meetings, dates discussed:    Comments:

## 2012-12-10 NOTE — Progress Notes (Signed)
   Subjective: 1 Day Post-Op Procedure(s) (LRB): LEFT TOTAL KNEE ARTHROPLASTY (Left)   Patient reports pain as mild, pain well controlled. No events throughout the night. Ready to be discharged home.   Objective:   VITALS:   Filed Vitals:   12/10/12 0410  BP: 118/72  Pulse: 83  Temp: 97.9 F (36.6 C)  Resp: 20    Neurovascular intact Dorsiflexion/Plantar flexion intact Incision: dressing C/D/I No cellulitis present Compartment soft  LABS  Recent Labs  12/10/12 0451  HGB 9.2*  HCT 26.7*  WBC 12.6*  PLT 198     Recent Labs  12/10/12 0451  NA 135  K 3.7  BUN 16  CREATININE 0.74  GLUCOSE 156*     Assessment/Plan: 1 Day Post-Op Procedure(s) (LRB): LEFT TOTAL KNEE ARTHROPLASTY (Left) HV drain d/ced Foley cath d/c'ed Advance diet Up with therapy D/C IV fluids Discharge home with home health Follow up in 2 weeks at [facility]. Follow up with OLIN,Roth Ress D in 2 weeks.  Contact information:  [facility] [address] [place] [address] [phone]    Expected ABLA  Treated with iron and will observe  Morbid Obesity (BMI >40)  Estimated body mass index is 41.27 kg/(m^2) as calculated from the following:   Height as of this encounter: 5\' 5"  (1.651 m).   Weight as of this encounter: 112.492 kg (248 lb). Patient also counseled that weight may inhibit the healing process Patient counseled that losing weight will help with future health issues      Anastasio Auerbach. Arshdeep Bolger   PAC  12/10/2012, 9:21 AM

## 2012-12-10 NOTE — Progress Notes (Signed)
Physical Therapy Treatment Patient Details Name: Tracey Foster MRN: [id number] DOB: Nov 27, 1955 Today's Date: 12/10/2012 Time: 5284-1324 PT Time Calculation (min): 31 min  PT Assessment / Plan / Recommendation  PT Comments   POD # 1 am session L TKR.  Amb pt in hallway then performed TKR TE's on both LE since pt had R TKR about a month ago.     Follow Up Recommendations  Home health PT     Does the patient have the potential to tolerate intense rehabilitation     Barriers to Discharge        Equipment Recommendations  None recommended by PT    Recommendations for Other Services    Frequency 7X/week   Progress towards PT Goals    Plan      Precautions / Restrictions Precautions Precautions: Knee Restrictions Weight Bearing Restrictions: No Other Position/Activity Restrictions: WBAT    Pertinent Vitals/Pain C/o 2/10 during TE's ICE applied    Mobility  Bed Mobility Bed Mobility: Not assessed Details for Bed Mobility Assistance: Pt OOB in recliner Transfers Transfers: Sit to Stand;Stand to Sit Sit to Stand: 5: Supervision;4: Min guard;From chair/3-in-1 Stand to Sit: 5: Supervision;4: Min guard;To chair/3-in-1 Details for Transfer Assistance: increased time/good hand placement Ambulation/Gait Ambulation/Gait Assistance: 5: Supervision;4: Min guard Ambulation Distance (Feet): 125 Feet Ambulation/Gait Assistance Details: increased time and <25% VC's on safety with turns and backward gait completion Gait Pattern: Step-to pattern;Decreased stance time - left;Trunk flexed Gait velocity: decreased    Exercises   Total Knee Replacement TE's performed on B LE's 10 reps B LE ankle pumps 10 reps knee presses 10 reps heel slides  10 reps SAQ's 10 reps SLR's 10 reps ABD Followed by ICE    PT Goals (current goals can now be found in the care plan section)                        progressing    Visit Information  Last PT Received On: 12/10/12 Assistance Needed:  +1 History of Present Illness: 57 y.o. female with L TKA    Subjective Data      Cognition       Balance     End of Session PT - End of Session Equipment Utilized During Treatment: Gait belt Activity Tolerance: Patient tolerated treatment well Patient left: in chair;with call bell/phone within reach;with family/visitor present   Felecia Shelling  PTA [facility]  Acute  Rehab Pager      [phone]

## 2012-12-10 NOTE — Evaluation (Signed)
Occupational Therapy Evaluation Patient Details Name: Tracey Foster MRN: [id number] DOB: [birth year] Today's Date: 12/10/2012 Time: 9562-1308 OT Time Calculation (min): 28 min  OT Assessment / Plan / Recommendation History of present illness 57 y.o. female with L TKA   Clinical Impression   Pt doing well. Familiar with tasks from recent R knee surgery. Will have assist at home. All education completed.     OT Assessment  Patient does not need any further OT services    Follow Up Recommendations  No OT follow up;Supervision/Assistance - 24 hour    Barriers to Discharge      Equipment Recommendations  None recommended by OT    Recommendations for Other Services    Frequency       Precautions / Restrictions Precautions Precautions: Knee Restrictions Weight Bearing Restrictions: No   Pertinent Vitals/Pain 1/10 ; ice    ADL  Eating/Feeding: Simulated;Independent Where Assessed - Eating/Feeding: Chair Grooming: Performed;Min guard Where Assessed - Grooming: Unsupported standing Upper Body Bathing: Simulated;Chest;Right arm;Left arm;Abdomen;Set up Where Assessed - Upper Body Bathing: Unsupported sitting Lower Body Bathing: Simulated;Min guard Where Assessed - Lower Body Bathing: Supported sit to stand Upper Body Dressing: Simulated;Set up Where Assessed - Upper Body Dressing: Unsupported sitting Lower Body Dressing: Simulated;Min guard Where Assessed - Lower Body Dressing: Supported sit to stand Toilet Transfer: Performed;Min guard Statistician Method: Other (comment) (with walker to 3in1) Toilet Transfer Equipment: Raised toilet seat with arms (or 3-in-1 over toilet) Toileting - Clothing Manipulation and Hygiene: Simulated;Min guard Where Assessed - Toileting Clothing Manipulation and Hygiene: Sit to stand from 3-in-1 or toilet Equipment Used: Rolling walker ADL Comments: Per pt she has a standard walker at home and tended to pick up RW for functional mobility similar  to standard walker use. Informed PT of this also. Pt has a riser but no handles. She doesnt want a 3in1 and feel she can push off the riser seat ok at home. She used handles of 3in1 here due to not enough space to push off 3in1 seat. She states several times she feels she will not need a 3in1. Daugther can help PRN also. She plans to sponge initially.     OT Diagnosis:    OT Problem List:   OT Treatment Interventions:     OT Goals(Current goals can be found in the care plan section) Acute Rehab OT Goals Patient Stated Goal: to return to independence.   Visit Information  Last OT Received On: 12/10/12 Assistance Needed: +1 History of Present Illness: 57 y.o. female with L TKA       Prior Functioning     Home Living Family/patient expects to be discharged to:: Private residence Living Arrangements: Children (daughter) Available Help at Discharge: Family Type of Home: House Home Access: Stairs to enter [employer] of Steps: 3 total; last 2 step have rails Home Layout: Two level;Able to live on main level with bedroom/bathroom Alternate Level Stairs-Number of Steps: 14 Home Equipment: Walker - standard;Cane - single point Additional Comments: riser without handles Prior Function Level of Independence: Independent with assistive device(s) Comments: independent with cane Communication Communication: No difficulties         Vision/Perception Vision - History Baseline Vision: No visual deficits   Cognition  Cognition Arousal/Alertness: Awake/alert Behavior During Therapy: WFL for tasks assessed/performed Overall Cognitive Status: Within Functional Limits for tasks assessed    Extremity/Trunk Assessment Upper Extremity Assessment Upper Extremity Assessment: Overall WFL for tasks assessed     Mobility Bed Mobility Bed  Mobility: Supine to Sit Supine to Sit: 5: Supervision;HOB elevated Transfers Transfers: Sit to Stand;Stand to Sit Sit to Stand: 4: Min  guard;With upper extremity assist;From bed;From chair/3-in-1 Stand to Sit: 4: Min guard;With upper extremity assist;To chair/3-in-1 Details for Transfer Assistance: min verbal cues for safety. needed 2 attempts to stand from EOB.     Exercise     Balance Balance Balance Assessed: Yes Dynamic Standing Balance Dynamic Standing - Level of Assistance: 5: Stand by assistance   End of Session OT - End of Session Activity Tolerance: Patient tolerated treatment well Patient left: in chair;with call bell/phone within reach  GO     Lennox Laity [phone] 12/10/2012, 9:35 AM

## 2012-12-11 NOTE — Discharge Summary (Signed)
Physician Discharge Summary  Patient ID: SERENAH MILL MRN: [id number] DOB/AGE: 1956/02/19 57 y.o.  Admit date: 12/09/2012 Discharge date: 12/10/2012   Procedures:  Procedure(s) (LRB): LEFT TOTAL KNEE ARTHROPLASTY (Left)  Attending Physician:  Dr. Durene Romans   Admission Diagnoses:   Left knee OA / pain  Discharge Diagnoses:  Principal Problem:   S/P left TKA Active Problems:   Expected blood loss anemia  Past Medical History  Diagnosis Date  . Hypertension   . Diabetes mellitus without complication     diet controlled no meds  . Arthritis     HPI: Tracey Foster, 57 y.o. female, has a history of pain and functional disability in the left knee due to arthritis and has failed non-surgical conservative treatments for greater than 12 weeks to includeNSAID's and/or analgesics, corticosteriod injections and activity modification. Onset of symptoms was gradual, starting 5 years ago with gradually worsening course since that time. The patient noted no past surgery on the left knee(s). Patient currently rates pain in the left knee(s) at 8 out of 10 with activity. Patient has worsening of pain with activity and weight bearing, pain that interferes with activities of daily living, pain with passive range of motion, crepitus and joint swelling. Patient has evidence of periarticular osteophytes and joint space narrowing by imaging studies. There is no active signs of infection. Risks, benefits and expectations were discussed with the patient. Patient understand the risks, benefits and expectations and wishes to proceed with surgery.   PCP: Lolita Patella, MD   Discharged Condition: good  Hospital Course:  Patient underwent the above stated procedure on 12/09/2012. Patient tolerated the procedure well and brought to the recovery room in good condition and subsequently to the floor.  POD #1 BP: 118/72 ; Pulse: 83 ; Temp: 97.9 F (36.6 C) ; Resp: 20  Pt's foley was removed, as well as  the hemovac drain removed. IV was changed to a saline lock. Patient reports pain as mild, pain well controlled. No events throughout the night. Ready to be discharged home.  Neurovascular intact, dorsiflexion/plantar flexion intact, incision: dressing C/D/I, no cellulitis present and compartment soft.   LABS  Basename    HGB  9.2  HCT  26.7    Discharge Exam: General appearance: alert, cooperative and no distress Extremities: Homans sign is negative, no sign of DVT, no edema, redness or tenderness in the calves or thighs and no ulcers, gangrene or trophic changes  Disposition:    Home-Health Care Svc with follow up in 2 weeks   Follow-up Information   Follow up with Shelda Pal, MD. Schedule an appointment as soon as possible for a visit in 2 weeks.   Contact information:   [address] [place] [address] [phone]       Discharge Orders   Future Orders Complete By Expires     Call MD / Call 911  As directed     Comments:      If you experience chest pain or shortness of breath, CALL 911 and be transported to the hospital emergency room.  If you develope a fever above 101 F, pus (white drainage) or increased drainage or redness at the wound, or calf pain, call your surgeon's office.    Change dressing  As directed     Comments:      Maintain surgical dressing for 10-14 days, then change the dressing daily with sterile 4 x 4 inch gauze dressing and tape. Keep the area  dry and clean.    Constipation Prevention  As directed     Comments:      Drink plenty of fluids.  Prune juice may be helpful.  You may use a stool softener, such as Colace (over the counter) 100 mg twice a day.  Use MiraLax (over the counter) for constipation as needed.    Diet - low sodium heart healthy  As directed     Discharge instructions  As directed     Comments:      Maintain surgical dressing for 10-14 days, then replace with gauze and tape. Keep the area dry and clean until  follow up. Follow up in 2 weeks at [facility]. Call with any questions or concerns.    Increase activity slowly as tolerated  As directed     TED hose  As directed     Comments:      Use stockings (TED hose) for 2 weeks on both leg(s).  You may remove them at night for sleeping.    Weight bearing as tolerated  As directed          Medication List         aspirin 325 MG EC tablet  Take 1 tablet (325 mg total) by mouth 2 (two) times daily.     cholecalciferol 1000 UNITS tablet  Commonly known as:  VITAMIN D  Take 1,000 Units by mouth every morning.     DSS 100 MG Caps  Take 100 mg by mouth 2 (two) times daily.     ezetimibe-simvastatin 10-40 MG per tablet  Commonly known as:  VYTORIN  Take 1 tablet by mouth at bedtime.     ferrous sulfate 325 (65 FE) MG tablet  Take 1 tablet (325 mg total) by mouth 3 (three) times daily after meals.     HYDROcodone-acetaminophen 7.5-325 MG per tablet  Commonly known as:  NORCO  Take 1-2 tablets by mouth every 4 (four) hours.     polyethylene glycol packet  Commonly known as:  MIRALAX / GLYCOLAX  Take 17 g by mouth 2 (two) times daily.     quinapril-hydrochlorothiazide 20-12.5 MG per tablet  Commonly known as:  ACCURETIC  Take 1 tablet by mouth every morning.         Signed: Anastasio Auerbach. Thaddaeus Granja   PAC  12/11/2012, 10:27 AM

## 2013-10-09 ENCOUNTER — Other Ambulatory Visit: Payer: Self-pay

## 2013-10-09 DIAGNOSIS — Z1231 Encounter for screening mammogram for malignant neoplasm of breast: Secondary | ICD-10-CM

## 2013-10-14 ENCOUNTER — Encounter (INDEPENDENT_AMBULATORY_CARE_PROVIDER_SITE_OTHER): Payer: Self-pay

## 2013-10-14 ENCOUNTER — Ambulatory Visit
Admission: RE | Admit: 2013-10-14 | Discharge: 2013-10-14 | Disposition: A | Discharge status: Home / Self Care | Payer: BC Managed Care – PPO | Source: Ambulatory Visit

## 2013-10-14 DIAGNOSIS — Z1231 Encounter for screening mammogram for malignant neoplasm of breast: Secondary | ICD-10-CM

## 2014-10-18 ENCOUNTER — Other Ambulatory Visit: Payer: Self-pay

## 2014-10-18 DIAGNOSIS — Z1231 Encounter for screening mammogram for malignant neoplasm of breast: Secondary | ICD-10-CM

## 2014-10-26 ENCOUNTER — Ambulatory Visit
Admission: RE | Admit: 2014-10-26 | Discharge: 2014-10-26 | Disposition: A | Discharge status: Home / Self Care | Payer: BLUE CROSS/BLUE SHIELD | Source: Ambulatory Visit

## 2014-10-26 DIAGNOSIS — Z1231 Encounter for screening mammogram for malignant neoplasm of breast: Secondary | ICD-10-CM

## 2015-10-15 DIAGNOSIS — R300 Dysuria: Secondary | ICD-10-CM | POA: Diagnosis not present

## 2016-01-16 ENCOUNTER — Other Ambulatory Visit: Payer: Self-pay | Admitting: Physician Assistant

## 2016-01-16 DIAGNOSIS — Z1231 Encounter for screening mammogram for malignant neoplasm of breast: Secondary | ICD-10-CM

## 2016-01-26 ENCOUNTER — Ambulatory Visit: Payer: BLUE CROSS/BLUE SHIELD

## 2016-01-26 ENCOUNTER — Ambulatory Visit
Admission: RE | Admit: 2016-01-26 | Discharge: 2016-01-26 | Disposition: A | Discharge status: Home / Self Care | Payer: BLUE CROSS/BLUE SHIELD | Source: Ambulatory Visit | Attending: Physician Assistant | Admitting: Physician Assistant

## 2016-01-26 DIAGNOSIS — Z1231 Encounter for screening mammogram for malignant neoplasm of breast: Secondary | ICD-10-CM | POA: Diagnosis not present

## 2016-03-16 ENCOUNTER — Other Ambulatory Visit: Payer: Self-pay | Admitting: Physician Assistant

## 2016-03-16 ENCOUNTER — Other Ambulatory Visit (HOSPITAL_COMMUNITY)
Admission: RE | Admit: 2016-03-16 | Discharge: 2016-03-16 | Disposition: A | Discharge status: Home / Self Care | Payer: BLUE CROSS/BLUE SHIELD | Source: Ambulatory Visit | Attending: Physician Assistant | Admitting: Physician Assistant

## 2016-03-16 DIAGNOSIS — E785 Hyperlipidemia, unspecified: Secondary | ICD-10-CM | POA: Diagnosis not present

## 2016-03-16 DIAGNOSIS — Z0000 Encounter for general adult medical examination without abnormal findings: Secondary | ICD-10-CM | POA: Diagnosis not present

## 2016-03-16 DIAGNOSIS — Z01419 Encounter for gynecological examination (general) (routine) without abnormal findings: Secondary | ICD-10-CM | POA: Insufficient documentation

## 2016-03-16 DIAGNOSIS — Z1151 Encounter for screening for human papillomavirus (HPV): Secondary | ICD-10-CM | POA: Insufficient documentation

## 2016-03-16 DIAGNOSIS — E119 Type 2 diabetes mellitus without complications: Secondary | ICD-10-CM | POA: Diagnosis not present

## 2016-03-16 DIAGNOSIS — Z124 Encounter for screening for malignant neoplasm of cervix: Secondary | ICD-10-CM | POA: Diagnosis not present

## 2016-03-16 DIAGNOSIS — I10 Essential (primary) hypertension: Secondary | ICD-10-CM | POA: Diagnosis not present

## 2016-03-16 DIAGNOSIS — E559 Vitamin D deficiency, unspecified: Secondary | ICD-10-CM | POA: Diagnosis not present

## 2016-03-19 LAB — CYTOLOGY - PAP
Diagnosis: NEGATIVE
HPV: NOT DETECTED

## 2016-11-12 DIAGNOSIS — E119 Type 2 diabetes mellitus without complications: Secondary | ICD-10-CM | POA: Diagnosis not present

## 2016-11-12 DIAGNOSIS — I10 Essential (primary) hypertension: Secondary | ICD-10-CM | POA: Diagnosis not present

## 2016-11-12 DIAGNOSIS — E785 Hyperlipidemia, unspecified: Secondary | ICD-10-CM | POA: Diagnosis not present

## 2017-02-18 ENCOUNTER — Other Ambulatory Visit: Payer: Self-pay | Admitting: Physician Assistant

## 2017-02-18 DIAGNOSIS — Z1231 Encounter for screening mammogram for malignant neoplasm of breast: Secondary | ICD-10-CM

## 2017-02-28 ENCOUNTER — Ambulatory Visit
Admission: RE | Admit: 2017-02-28 | Discharge: 2017-02-28 | Disposition: A | Discharge status: Home / Self Care | Payer: BLUE CROSS/BLUE SHIELD | Source: Ambulatory Visit | Attending: Physician Assistant | Admitting: Physician Assistant

## 2017-02-28 DIAGNOSIS — Z1231 Encounter for screening mammogram for malignant neoplasm of breast: Secondary | ICD-10-CM | POA: Diagnosis not present

## 2017-05-04 DIAGNOSIS — L304 Erythema intertrigo: Secondary | ICD-10-CM | POA: Diagnosis not present

## 2017-05-15 DIAGNOSIS — E785 Hyperlipidemia, unspecified: Secondary | ICD-10-CM | POA: Diagnosis not present

## 2017-05-15 DIAGNOSIS — I10 Essential (primary) hypertension: Secondary | ICD-10-CM | POA: Diagnosis not present

## 2017-05-15 DIAGNOSIS — E119 Type 2 diabetes mellitus without complications: Secondary | ICD-10-CM | POA: Diagnosis not present

## 2017-05-15 DIAGNOSIS — B372 Candidiasis of skin and nail: Secondary | ICD-10-CM | POA: Diagnosis not present

## 2017-05-22 DIAGNOSIS — E119 Type 2 diabetes mellitus without complications: Secondary | ICD-10-CM | POA: Diagnosis not present

## 2017-11-11 DIAGNOSIS — E119 Type 2 diabetes mellitus without complications: Secondary | ICD-10-CM | POA: Diagnosis not present

## 2017-11-11 DIAGNOSIS — E559 Vitamin D deficiency, unspecified: Secondary | ICD-10-CM | POA: Diagnosis not present

## 2017-11-11 DIAGNOSIS — I10 Essential (primary) hypertension: Secondary | ICD-10-CM | POA: Diagnosis not present

## 2017-11-11 DIAGNOSIS — E785 Hyperlipidemia, unspecified: Secondary | ICD-10-CM | POA: Diagnosis not present

## 2017-11-16 DIAGNOSIS — H9192 Unspecified hearing loss, left ear: Secondary | ICD-10-CM | POA: Diagnosis not present

## 2017-11-16 DIAGNOSIS — H6123 Impacted cerumen, bilateral: Secondary | ICD-10-CM | POA: Diagnosis not present

## 2018-01-30 ENCOUNTER — Other Ambulatory Visit: Payer: Self-pay | Admitting: Physician Assistant

## 2018-01-30 DIAGNOSIS — Z1231 Encounter for screening mammogram for malignant neoplasm of breast: Secondary | ICD-10-CM

## 2018-03-16 DIAGNOSIS — B379 Candidiasis, unspecified: Secondary | ICD-10-CM | POA: Diagnosis not present

## 2018-03-24 ENCOUNTER — Ambulatory Visit
Admission: RE | Admit: 2018-03-24 | Discharge: 2018-03-24 | Disposition: A | Discharge status: Home / Self Care | Payer: BLUE CROSS/BLUE SHIELD | Source: Ambulatory Visit | Attending: Physician Assistant | Admitting: Physician Assistant

## 2018-03-24 DIAGNOSIS — Z1231 Encounter for screening mammogram for malignant neoplasm of breast: Secondary | ICD-10-CM | POA: Diagnosis not present

## 2018-05-15 DIAGNOSIS — E559 Vitamin D deficiency, unspecified: Secondary | ICD-10-CM | POA: Diagnosis not present

## 2018-05-15 DIAGNOSIS — E119 Type 2 diabetes mellitus without complications: Secondary | ICD-10-CM | POA: Diagnosis not present

## 2018-05-15 DIAGNOSIS — Z0000 Encounter for general adult medical examination without abnormal findings: Secondary | ICD-10-CM | POA: Diagnosis not present

## 2018-05-15 DIAGNOSIS — I10 Essential (primary) hypertension: Secondary | ICD-10-CM | POA: Diagnosis not present

## 2018-05-15 DIAGNOSIS — E785 Hyperlipidemia, unspecified: Secondary | ICD-10-CM | POA: Diagnosis not present

## 2018-05-16 ENCOUNTER — Other Ambulatory Visit: Payer: Self-pay | Admitting: Physician Assistant

## 2018-05-16 DIAGNOSIS — M8580 Other specified disorders of bone density and structure, unspecified site: Secondary | ICD-10-CM

## 2018-06-25 ENCOUNTER — Ambulatory Visit
Admission: RE | Admit: 2018-06-25 | Discharge: 2018-06-25 | Disposition: A | Discharge status: Home / Self Care | Payer: BLUE CROSS/BLUE SHIELD | Source: Ambulatory Visit | Attending: Physician Assistant | Admitting: Physician Assistant

## 2018-06-25 DIAGNOSIS — Z780 Asymptomatic menopausal state: Secondary | ICD-10-CM | POA: Diagnosis not present

## 2018-06-25 DIAGNOSIS — M8580 Other specified disorders of bone density and structure, unspecified site: Secondary | ICD-10-CM

## 2018-06-25 DIAGNOSIS — Z13820 Encounter for screening for osteoporosis: Secondary | ICD-10-CM | POA: Diagnosis not present

## 2018-08-20 DIAGNOSIS — E119 Type 2 diabetes mellitus without complications: Secondary | ICD-10-CM | POA: Diagnosis not present

## 2018-10-10 DIAGNOSIS — R319 Hematuria, unspecified: Secondary | ICD-10-CM | POA: Diagnosis not present

## 2018-11-24 DIAGNOSIS — E1169 Type 2 diabetes mellitus with other specified complication: Secondary | ICD-10-CM | POA: Diagnosis not present

## 2018-11-24 DIAGNOSIS — E785 Hyperlipidemia, unspecified: Secondary | ICD-10-CM | POA: Diagnosis not present

## 2018-11-24 DIAGNOSIS — E559 Vitamin D deficiency, unspecified: Secondary | ICD-10-CM | POA: Diagnosis not present

## 2018-11-24 DIAGNOSIS — I10 Essential (primary) hypertension: Secondary | ICD-10-CM | POA: Diagnosis not present

## 2018-11-25 DIAGNOSIS — E559 Vitamin D deficiency, unspecified: Secondary | ICD-10-CM | POA: Diagnosis not present

## 2018-11-25 DIAGNOSIS — E785 Hyperlipidemia, unspecified: Secondary | ICD-10-CM | POA: Diagnosis not present

## 2018-11-25 DIAGNOSIS — I10 Essential (primary) hypertension: Secondary | ICD-10-CM | POA: Diagnosis not present

## 2018-11-25 DIAGNOSIS — E1169 Type 2 diabetes mellitus with other specified complication: Secondary | ICD-10-CM | POA: Diagnosis not present

## 2019-02-24 ENCOUNTER — Other Ambulatory Visit: Payer: Self-pay | Admitting: Physician Assistant

## 2019-02-24 DIAGNOSIS — Z1231 Encounter for screening mammogram for malignant neoplasm of breast: Secondary | ICD-10-CM

## 2019-04-10 ENCOUNTER — Other Ambulatory Visit: Payer: Self-pay

## 2019-04-10 ENCOUNTER — Ambulatory Visit
Admission: RE | Admit: 2019-04-10 | Discharge: 2019-04-10 | Disposition: A | Discharge status: Home / Self Care | Payer: BC Managed Care – PPO | Source: Ambulatory Visit | Attending: Physician Assistant | Admitting: Physician Assistant

## 2019-04-10 DIAGNOSIS — Z1231 Encounter for screening mammogram for malignant neoplasm of breast: Secondary | ICD-10-CM

## 2019-04-22 DIAGNOSIS — E119 Type 2 diabetes mellitus without complications: Secondary | ICD-10-CM | POA: Diagnosis not present

## 2019-05-20 DIAGNOSIS — E785 Hyperlipidemia, unspecified: Secondary | ICD-10-CM | POA: Diagnosis not present

## 2019-05-20 DIAGNOSIS — I10 Essential (primary) hypertension: Secondary | ICD-10-CM | POA: Diagnosis not present

## 2019-05-20 DIAGNOSIS — Z7984 Long term (current) use of oral hypoglycemic drugs: Secondary | ICD-10-CM | POA: Diagnosis not present

## 2019-05-20 DIAGNOSIS — E1169 Type 2 diabetes mellitus with other specified complication: Secondary | ICD-10-CM | POA: Diagnosis not present

## 2019-08-03 DIAGNOSIS — N939 Abnormal uterine and vaginal bleeding, unspecified: Secondary | ICD-10-CM | POA: Diagnosis not present

## 2019-08-03 DIAGNOSIS — R319 Hematuria, unspecified: Secondary | ICD-10-CM | POA: Diagnosis not present

## 2019-08-19 ENCOUNTER — Other Ambulatory Visit: Payer: Self-pay | Admitting: Obstetrics and Gynecology

## 2019-08-19 DIAGNOSIS — C541 Malignant neoplasm of endometrium: Secondary | ICD-10-CM | POA: Diagnosis not present

## 2019-08-19 DIAGNOSIS — R9389 Abnormal findings on diagnostic imaging of other specified body structures: Secondary | ICD-10-CM | POA: Diagnosis not present

## 2019-08-19 DIAGNOSIS — D259 Leiomyoma of uterus, unspecified: Secondary | ICD-10-CM | POA: Diagnosis not present

## 2019-08-19 DIAGNOSIS — N950 Postmenopausal bleeding: Secondary | ICD-10-CM | POA: Diagnosis not present

## 2019-08-25 DIAGNOSIS — N950 Postmenopausal bleeding: Secondary | ICD-10-CM | POA: Diagnosis not present

## 2019-08-25 DIAGNOSIS — C541 Malignant neoplasm of endometrium: Secondary | ICD-10-CM | POA: Diagnosis not present

## 2019-08-26 ENCOUNTER — Telehealth: Payer: Self-pay | Admitting: *Deleted

## 2019-08-26 NOTE — Telephone Encounter (Signed)
Called the patient and scheduled a new patient appt. Gave the patient the phone number and address of the clinic. Gave the policy for parking, mask and visitors. Also explained that she will have a pelvic exam

## 2019-08-27 ENCOUNTER — Telehealth: Payer: Self-pay | Admitting: *Deleted

## 2019-08-27 DIAGNOSIS — C541 Malignant neoplasm of endometrium: Secondary | ICD-10-CM | POA: Diagnosis not present

## 2019-08-27 DIAGNOSIS — Z0000 Encounter for general adult medical examination without abnormal findings: Secondary | ICD-10-CM | POA: Diagnosis not present

## 2019-08-27 DIAGNOSIS — E785 Hyperlipidemia, unspecified: Secondary | ICD-10-CM | POA: Diagnosis not present

## 2019-08-27 DIAGNOSIS — I10 Essential (primary) hypertension: Secondary | ICD-10-CM | POA: Diagnosis not present

## 2019-08-27 DIAGNOSIS — E1169 Type 2 diabetes mellitus with other specified complication: Secondary | ICD-10-CM | POA: Diagnosis not present

## 2019-08-27 NOTE — Telephone Encounter (Signed)
Called the patient and moved her appt from 4/5 to 3/31

## 2019-09-01 NOTE — Progress Notes (Signed)
GYNECOLOGIC ONCOLOGY NEW PATIENT CONSULTATION   Patient Name: Tracey Foster  Patient Age: 64 y.o. 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  We reviewed the nature of endometrial cancer and its recommended surgical staging, including total hysterectomy, bilateral salpingo-oophorectomy, and lymph node assessment. The patient is a suitable candidate for staging via a minimally invasive approach to surgery.  We reviewed that robotic assistance would be used to complete the surgery.   We discussed that most endometrial cancer is detected early and that decisions regarding adjuvant therapy will be made based on her final pathology.   We reviewed the sentinel lymph node technique. Risks and benefits of sentinel lymph node biopsy was reviewed. We reviewed the technique and ICG dye. The patient DOES NOT have an iodine allergy or known liver dysfunction. We reviewed the false negative rate (0.4%), and that 3% of patients with metastatic disease will not have it detected by SLN biopsy in endometrial cancer. A low risk of allergic reaction to the dye, <0.2% for ICG, has been reported. We also discussed that in the case of failed mapping, which occurs 40% of the time, a bilateral or unilateral lymphadenectomy will be performed at the surgeon's discretion.   Potential benefits of sentinel nodes including a higher detection rate for metastasis due to ultrastaging and potential reduction in operative morbidity. However, there remains uncertainty as to the role for treatment of micrometastatic disease. Further, the benefit of operative morbidity associated with the SLN technique in endometrial cancer is not yet completely known. In other patient populations (e.g. the  cervical cancer population) there has been observed reductions in morbidity with SLN biopsy compared to pelvic lymphadenectomy. Lymphedema, nerve dysfunction and lymphocysts are all potential risks with the SLN technique as with complete lymphadenectomy. Additional risks to the patient include the risk of damage to an internal organ while operating in an altered view (e.g. the black and white image of the robotic fluorescence imaging mode).   The patient was consented for a robotic assisted hysterectomy, bilateral salpingo-oophorectomy, sentinel lymph node evaluation, possible lymph node dissection, possible laparotomy. The risks of surgery were discussed in detail and she understands these to include infection; wound separation; hernia; vaginal cuff separation, injury to adjacent organs such as bowel, bladder, blood vessels, ureters and nerves; bleeding which may require blood transfusion; anesthesia risk; thromboembolic events; possible death; unforeseen complications; possible need for re-exploration; medical complications such as heart attack, stroke, pleural effusion and pneumonia; and, if full lymphadenectomy is performed the risk of lymphedema and lymphocyst. The patient will receive DVT and antibiotic prophylaxis as indicated. She voiced a clear understanding. She had the opportunity to ask questions. Perioperative instructions were reviewed with her. Prescriptions for post-op medications were sent to her pharmacy of choice.  A copy of this note was sent to the patient's referring provider.   45 minutes of total time was spent for this patient encounter, including preparation, face-to-face counseling with the patient and coordination of care, and documentation of the encounter.   Jeral Pinch, MD  Division of Gynecologic Oncology  Department of Obstetrics and Gynecology  [facility]  ___________________________________________  Chief Complaint: Chief Complaint   Patient presents with  . Endometrial cancer [facility])    New patient    History of Present  Illness:  Tracey Foster is a 64 y.o. y.o. female who is seen in consultation at the request of Dr. Landry Mellow for an evaluation of endometrial cancer.  Patient presented on March 17 with scant and intermittent postmenopausal bleeding for 1 year.  She was ultimately referred to a gynecologist after being seen at a walk-in clinic after work on March 1, when she had an episode of significant bleeding.  Since that time, she has had only scant bleeding.  She denies any associated cramping or pain.  When she was seen on March 17, her uterus measured 9.2 x 6.4 x 6.5 cm on clinic pelvic ultrasound with a thickened endometrial lining measuring 2.5 cm.  6 fibroids were noted, the largest measuring 2.9 cm.  Endometrial biopsy on March 17 revealed FIGO grade 1 endometrioid carcinoma.  Overall she has been doing well.  She has lost about 10 pounds in the last year that has been intentional.  She reports normal bowel function.  She has some nocturia and increased frequency that has been her baseline and unchanged.  She endorses a good appetite without nausea or vomiting.  She denies any shortness of breath at rest or with ambulation.  She is able to climb a flight of stairs slowly secondary to limited mobility related to bilateral knee replacement.  Her past medical history is significant for hypertension, hyperlipidemia, and diabetes.  She checks her glucose at home and it typically runs 115-1:30 in the morning.  Her mobility is somewhat limited by her bilateral knee replacement.  She has better range of motion with her right leg, somewhat limited in her left.  PAST MEDICAL HISTORY:  Past Medical History:  Diagnosis Date  . Diabetes mellitus without complication (HCC)    diet controlled no meds  . Endometrial cancer ([place])   . High cholesterol   . Hypertension   . Obesity (BMI 30.0-34.9)      PAST SURGICAL HISTORY:  Past  Surgical History:  Procedure Laterality Date  . COLONOSCOPY    . TOTAL KNEE ARTHROPLASTY Right 11/11/2012   Procedure: RIGHT TOTAL KNEE ARTHROPLASTY;  Surgeon: Mauri Pole, MD;  Location: [facility]S;  Service: Orthopedics;  Laterality: Right;  . TOTAL KNEE ARTHROPLASTY Left 12/09/2012   Procedure: LEFT TOTAL KNEE ARTHROPLASTY;  Surgeon: Mauri Pole, MD;  Location: [facility];  Service: Orthopedics;  Laterality: Left;  . WISDOM TOOTH EXTRACTION      OB/GYN HISTORY:  OB History  Gravida Para Term Preterm AB Living  1 1          SAB TAB Ectopic Multiple Live Births               # Outcome Date GA Lbr Len/2nd Weight Sex Delivery Anes PTL Lv  1 Para             No LMP recorded. Patient is postmenopausal.  Age at menarche: 46  Age at menopause: 32-48 Hx of HRT: Denies Hx of STDs: Denies Last pap: 03/2016 - negative, HPV not detected History of abnormal pap smears: Reports possibly a remote history of an abnormal Pap but then explains that she had a D&C.  I suspect this was for abnormal uterine bleeding.  SCREENING STUDIES:  Last mammogram: 04/2019  Last colonoscopy: >5 years, thinks <10 years Last bone mineral density: 06/2018  MEDICATIONS: Outpatient Encounter Medications as of 09/02/2019  Medication Sig  . ASPIRIN 81 PO Take 81 mg by mouth daily.  . cholecalciferol (VITAMIN D) 1000 UNITS tablet  Take 1,000 Units by mouth every morning. Patient takes 2000 units daily  . ezetimibe-simvastatin (VYTORIN) 10-40 MG per tablet Take 1 tablet by mouth at bedtime.  Marland Kitchen JARDIANCE 25 MG TABS tablet Take 25 mg by mouth daily.  Marland Kitchen lisinopril-hydrochlorothiazide (ZESTORETIC) 20-12.5 MG tablet Take 1 tablet by mouth daily.  . metFORMIN (GLUCOPHAGE) 1000 MG tablet Take 1,000 mg by mouth 2 (two) times daily.  Marland Kitchen OZEMPIC, 0.25 OR 0.5 MG/DOSE, 2 MG/1.5ML SOPN INJECT 0.5MG  DOSE INTO SKIN ONCE WEEKLY  . ibuprofen (ADVIL) 600 MG tablet Take 1 tablet (600 mg total) by mouth every 8 (eight) hours as needed for  mild pain. For AFTER surgery  . senna-docusate (SENOKOT-S) 8.6-50 MG tablet Take 2 tablets by mouth at bedtime. For AFTER surgery, do not take if having diarrhea  . traMADol (ULTRAM) 50 MG tablet Take 1 tablet (50 mg total) by mouth every 6 (six) hours as needed for severe pain. For AFTER surgery, do not take and drive   No facility-administered encounter medications on file as of 09/02/2019.    ALLERGIES:  Allergies  Allergen Reactions  . Penicillins Hives     FAMILY HISTORY:  Family History  Problem Relation Age of Onset  . Hypertension Mother   . Hyperlipidemia Mother   . Diabetes Father   . Hypertension Father   . Hypertension Paternal Grandmother   . Diabetes Paternal Grandmother   . Heart attack Paternal Grandmother   . Hypertension Paternal Grandfather   . Colon cancer Neg Hx   . Uterine cancer Neg Hx   . Breast cancer Neg Hx   . Ovarian cancer Neg Hx      SOCIAL HISTORY:    Social Connections:   . Frequency of Communication with Friends and Family:   . Frequency of Social Gatherings with Friends and Family:   . Attends Religious Services:   . Active Member of Clubs or Organizations:   . Attends Archivist Meetings:   Marland Kitchen Marital Status:     REVIEW OF SYSTEMS:  Reports vaginal bleeding Denies appetite changes, fevers, chills, fatigue, unexplained weight changes. Denies hearing loss, neck lumps or masses, mouth sores, ringing in ears or voice changes. Denies cough or wheezing.  Denies shortness of breath. Denies chest pain or palpitations. Denies leg swelling. Denies abdominal distention, pain, blood in stools, constipation, diarrhea, nausea, vomiting, or early satiety. Denies pain with intercourse, dysuria, hematuria or incontinence. Denies hot flashes, pelvic pain, or vaginal discharge.   Denies joint pain, back pain or muscle pain/cramps. Denies itching, rash, or wounds. Denies dizziness, headaches, numbness or seizures. Denies swollen lymph nodes  or glands, denies easy bruising or bleeding. Denies anxiety, depression, confusion, or decreased concentration.  Physical Exam:  Vital Signs for this encounter:  Blood pressure 114/63, pulse 72, temperature 97.8 F (36.6 C), temperature source Temporal, resp. rate 16, height 5\' 5"  (1.651 m), weight 209 lb 4 oz (94.9 kg), SpO2 100 %. Body mass index is 34.82 kg/m. General: Alert, oriented, no acute distress.  HEENT: Normocephalic, atraumatic. Sclera anicteric.  Chest: Clear to auscultation bilaterally. No wheezes, rhonchi, or rales. Cardiovascular: Regular rate and rhythm, no murmurs, rubs, or gallops.  Abdomen: Obese. Normoactive bowel sounds. Soft, nondistended, nontender to palpation. No masses or hepatosplenomegaly appreciated. No palpable fluid wave.  Extremities: Grossly normal range of motion. Warm, well perfused. No edema bilaterally.  Skin: No rashes or lesions.  Lymphatics: No cervical, supraclavicular, or inguinal adenopathy.  GU:  Normal external female genitalia.  No lesions.  No discharge or bleeding.             Bladder/urethra:  No lesions or masses, some prolapse noted anteriorly             Vagina: Mildly atrophic vaginal mucosa.             Cervix: Normal appearing, quite protuberant, no lesions.             Uterus: 8-10 cm, mobile, no parametrial involvement or nodularity.             Adnexa: No masses.  Rectal: No nodularity appreciated.  LABORATORY AND RADIOLOGIC DATA:  Outside medical records were reviewed to synthesize the above history, along with the history and physical obtained during the visit.   Lab Results  Component Value Date   WBC 12.6 (H) 12/10/2012   HGB 9.2 (L) 12/10/2012   HCT 26.7 (L) 12/10/2012   PLT 198 12/10/2012   GLUCOSE 156 (H) 12/10/2012   NA 135 12/10/2012   K 3.7 12/10/2012   CL 101 12/10/2012   CREATININE 0.74 12/10/2012   BUN 16 12/10/2012   CO2 25 12/10/2012   INR 1.06 12/03/2012   EMB 3/17: - ENDOMETRIOID CARCINOMA, FIGO  GRADE 1 OF 3.  Recent labs from [facility]: Hemoglobin A1c-6.7% BMP: Glucose 113, BUN 15, creatinine 0.88, sodium 139, potassium 4.1, chloride 102, bicarb 26, anion gap 15.5, calcium 9.9

## 2019-09-01 NOTE — H&P (View-Only) (Signed)
GYNECOLOGIC ONCOLOGY NEW PATIENT CONSULTATION   Patient Name: Tracey Foster  Patient Age: 64 y.o. 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  We reviewed the nature of endometrial cancer and its recommended surgical staging, including total hysterectomy, bilateral salpingo-oophorectomy, and lymph node assessment. The patient is a suitable candidate for staging via a minimally invasive approach to surgery.  We reviewed that robotic assistance would be used to complete the surgery.   We discussed that most endometrial cancer is detected early and that decisions regarding adjuvant therapy will be made based on her final pathology.   We reviewed the sentinel lymph node technique. Risks and benefits of sentinel lymph node biopsy was reviewed. We reviewed the technique and ICG dye. The patient DOES NOT have an iodine allergy or known liver dysfunction. We reviewed the false negative rate (0.4%), and that 3% of patients with metastatic disease will not have it detected by SLN biopsy in endometrial cancer. A low risk of allergic reaction to the dye, <0.2% for ICG, has been reported. We also discussed that in the case of failed mapping, which occurs 40% of the time, a bilateral or unilateral lymphadenectomy will be performed at the surgeon's discretion.   Potential benefits of sentinel nodes including a higher detection rate for metastasis due to ultrastaging and potential reduction in operative morbidity. However, there remains uncertainty as to the role for treatment of micrometastatic disease. Further, the benefit of operative morbidity associated with the SLN technique in endometrial cancer is not yet completely known. In other patient populations (e.g. the  cervical cancer population) there has been observed reductions in morbidity with SLN biopsy compared to pelvic lymphadenectomy. Lymphedema, nerve dysfunction and lymphocysts are all potential risks with the SLN technique as with complete lymphadenectomy. Additional risks to the patient include the risk of damage to an internal organ while operating in an altered view (e.g. the black and white image of the robotic fluorescence imaging mode).   The patient was consented for a robotic assisted hysterectomy, bilateral salpingo-oophorectomy, sentinel lymph node evaluation, possible lymph node dissection, possible laparotomy. The risks of surgery were discussed in detail and she understands these to include infection; wound separation; hernia; vaginal cuff separation, injury to adjacent organs such as bowel, bladder, blood vessels, ureters and nerves; bleeding which may require blood transfusion; anesthesia risk; thromboembolic events; possible death; unforeseen complications; possible need for re-exploration; medical complications such as heart attack, stroke, pleural effusion and pneumonia; and, if full lymphadenectomy is performed the risk of lymphedema and lymphocyst. The patient will receive DVT and antibiotic prophylaxis as indicated. She voiced a clear understanding. She had the opportunity to ask questions. Perioperative instructions were reviewed with her. Prescriptions for post-op medications were sent to her pharmacy of choice.  A copy of this note was sent to the patient's referring provider.   45 minutes of total time was spent for this patient encounter, including preparation, face-to-face counseling with the patient and coordination of care, and documentation of the encounter.   Jeral Pinch, MD  Division of Gynecologic Oncology  Department of Obstetrics and Gynecology  [facility]  ___________________________________________  Chief Complaint: Chief Complaint   Patient presents with  . Endometrial cancer [facility])    New patient    History of Present  Illness:  Tracey Foster is a 64 y.o. y.o. female who is seen in consultation at the request of Dr. Landry Mellow for an evaluation of endometrial cancer.  Patient presented on March 17 with scant and intermittent postmenopausal bleeding for 1 year.  She was ultimately referred to a gynecologist after being seen at a walk-in clinic after work on March 1, when she had an episode of significant bleeding.  Since that time, she has had only scant bleeding.  She denies any associated cramping or pain.  When she was seen on March 17, her uterus measured 9.2 x 6.4 x 6.5 cm on clinic pelvic ultrasound with a thickened endometrial lining measuring 2.5 cm.  6 fibroids were noted, the largest measuring 2.9 cm.  Endometrial biopsy on March 17 revealed FIGO grade 1 endometrioid carcinoma.  Overall she has been doing well.  She has lost about 10 pounds in the last year that has been intentional.  She reports normal bowel function.  She has some nocturia and increased frequency that has been her baseline and unchanged.  She endorses a good appetite without nausea or vomiting.  She denies any shortness of breath at rest or with ambulation.  She is able to climb a flight of stairs slowly secondary to limited mobility related to bilateral knee replacement.  Her past medical history is significant for hypertension, hyperlipidemia, and diabetes.  She checks her glucose at home and it typically runs 115-1:30 in the morning.  Her mobility is somewhat limited by her bilateral knee replacement.  She has better range of motion with her right leg, somewhat limited in her left.  PAST MEDICAL HISTORY:  Past Medical History:  Diagnosis Date  . Diabetes mellitus without complication (HCC)    diet controlled no meds  . Endometrial cancer ([place])   . High cholesterol   . Hypertension   . Obesity (BMI 30.0-34.9)      PAST SURGICAL HISTORY:  Past  Surgical History:  Procedure Laterality Date  . COLONOSCOPY    . TOTAL KNEE ARTHROPLASTY Right 11/11/2012   Procedure: RIGHT TOTAL KNEE ARTHROPLASTY;  Surgeon: Mauri Pole, MD;  Location: [facility];  Service: Orthopedics;  Laterality: Right;  . TOTAL KNEE ARTHROPLASTY Left 12/09/2012   Procedure: LEFT TOTAL KNEE ARTHROPLASTY;  Surgeon: Mauri Pole, MD;  Location: [facility];  Service: Orthopedics;  Laterality: Left;  . WISDOM TOOTH EXTRACTION      OB/GYN HISTORY:  OB History  Gravida Para Term Preterm AB Living  1 1          SAB TAB Ectopic Multiple Live Births               # Outcome Date GA Lbr Len/2nd Weight Sex Delivery Anes PTL Lv  1 Para             No LMP recorded. Patient is postmenopausal.  Age at menarche: 67  Age at menopause: 54-48 Hx of HRT: Denies Hx of STDs: Denies Last pap: 03/2016 - negative, HPV not detected History of abnormal pap smears: Reports possibly a remote history of an abnormal Pap but then explains that she had a D&C.  I suspect this was for abnormal uterine bleeding.  SCREENING STUDIES:  Last mammogram: 04/2019  Last colonoscopy: >5 years, thinks <10 years Last bone mineral density: 06/2018  MEDICATIONS: Outpatient Encounter Medications as of 09/02/2019  Medication Sig  . ASPIRIN 81 PO Take 81 mg by mouth daily.  . cholecalciferol (VITAMIN D) 1000 UNITS tablet  Take 1,000 Units by mouth every morning. Patient takes 2000 units daily  . ezetimibe-simvastatin (VYTORIN) 10-40 MG per tablet Take 1 tablet by mouth at bedtime.  Marland Kitchen JARDIANCE 25 MG TABS tablet Take 25 mg by mouth daily.  Marland Kitchen lisinopril-hydrochlorothiazide (ZESTORETIC) 20-12.5 MG tablet Take 1 tablet by mouth daily.  . metFORMIN (GLUCOPHAGE) 1000 MG tablet Take 1,000 mg by mouth 2 (two) times daily.  Marland Kitchen OZEMPIC, 0.25 OR 0.5 MG/DOSE, 2 MG/1.5ML SOPN INJECT 0.5MG  DOSE INTO SKIN ONCE WEEKLY  . ibuprofen (ADVIL) 600 MG tablet Take 1 tablet (600 mg total) by mouth every 8 (eight) hours as needed for  mild pain. For AFTER surgery  . senna-docusate (SENOKOT-S) 8.6-50 MG tablet Take 2 tablets by mouth at bedtime. For AFTER surgery, do not take if having diarrhea  . traMADol (ULTRAM) 50 MG tablet Take 1 tablet (50 mg total) by mouth every 6 (six) hours as needed for severe pain. For AFTER surgery, do not take and drive   No facility-administered encounter medications on file as of 09/02/2019.    ALLERGIES:  Allergies  Allergen Reactions  . Penicillins Hives     FAMILY HISTORY:  Family History  Problem Relation Age of Onset  . Hypertension Mother   . Hyperlipidemia Mother   . Diabetes Father   . Hypertension Father   . Hypertension Paternal Grandmother   . Diabetes Paternal Grandmother   . Heart attack Paternal Grandmother   . Hypertension Paternal Grandfather   . Colon cancer Neg Hx   . Uterine cancer Neg Hx   . Breast cancer Neg Hx   . Ovarian cancer Neg Hx      SOCIAL HISTORY:    Social Connections:   . Frequency of Communication with Friends and Family:   . Frequency of Social Gatherings with Friends and Family:   . Attends Religious Services:   . Active Member of Clubs or Organizations:   . Attends Archivist Meetings:   Marland Kitchen Marital Status:     REVIEW OF SYSTEMS:  Reports vaginal bleeding Denies appetite changes, fevers, chills, fatigue, unexplained weight changes. Denies hearing loss, neck lumps or masses, mouth sores, ringing in ears or voice changes. Denies cough or wheezing.  Denies shortness of breath. Denies chest pain or palpitations. Denies leg swelling. Denies abdominal distention, pain, blood in stools, constipation, diarrhea, nausea, vomiting, or early satiety. Denies pain with intercourse, dysuria, hematuria or incontinence. Denies hot flashes, pelvic pain, or vaginal discharge.   Denies joint pain, back pain or muscle pain/cramps. Denies itching, rash, or wounds. Denies dizziness, headaches, numbness or seizures. Denies swollen lymph nodes  or glands, denies easy bruising or bleeding. Denies anxiety, depression, confusion, or decreased concentration.  Physical Exam:  Vital Signs for this encounter:  Blood pressure 114/63, pulse 72, temperature 97.8 F (36.6 C), temperature source Temporal, resp. rate 16, height 5\' 5"  (1.651 m), weight 209 lb 4 oz (94.9 kg), SpO2 100 %. Body mass index is 34.82 kg/m. General: Alert, oriented, no acute distress.  HEENT: Normocephalic, atraumatic. Sclera anicteric.  Chest: Clear to auscultation bilaterally. No wheezes, rhonchi, or rales. Cardiovascular: Regular rate and rhythm, no murmurs, rubs, or gallops.  Abdomen: Obese. Normoactive bowel sounds. Soft, nondistended, nontender to palpation. No masses or hepatosplenomegaly appreciated. No palpable fluid wave.  Extremities: Grossly normal range of motion. Warm, well perfused. No edema bilaterally.  Skin: No rashes or lesions.  Lymphatics: No cervical, supraclavicular, or inguinal adenopathy.  GU:  Normal external female genitalia.  No lesions.  No discharge or bleeding.             Bladder/urethra:  No lesions or masses, some prolapse noted anteriorly             Vagina: Mildly atrophic vaginal mucosa.             Cervix: Normal appearing, quite protuberant, no lesions.             Uterus: 8-10 cm, mobile, no parametrial involvement or nodularity.             Adnexa: No masses.  Rectal: No nodularity appreciated.  LABORATORY AND RADIOLOGIC DATA:  Outside medical records were reviewed to synthesize the above history, along with the history and physical obtained during the visit.   Lab Results  Component Value Date   WBC 12.6 (H) 12/10/2012   HGB 9.2 (L) 12/10/2012   HCT 26.7 (L) 12/10/2012   PLT 198 12/10/2012   GLUCOSE 156 (H) 12/10/2012   NA 135 12/10/2012   K 3.7 12/10/2012   CL 101 12/10/2012   CREATININE 0.74 12/10/2012   BUN 16 12/10/2012   CO2 25 12/10/2012   INR 1.06 12/03/2012   EMB 3/17: - ENDOMETRIOID CARCINOMA, FIGO  GRADE 1 OF 3.  Recent labs from [facility]: Hemoglobin A1c-6.7% BMP: Glucose 113, BUN 15, creatinine 0.88, sodium 139, potassium 4.1, chloride 102, bicarb 26, anion gap 15.5, calcium 9.9

## 2019-09-02 ENCOUNTER — Encounter: Payer: Self-pay | Admitting: Gynecologic Oncology

## 2019-09-02 ENCOUNTER — Inpatient Hospital Stay: Payer: BC Managed Care – PPO | Attending: Gynecologic Oncology | Admitting: Gynecologic Oncology

## 2019-09-02 ENCOUNTER — Other Ambulatory Visit: Payer: Self-pay

## 2019-09-02 ENCOUNTER — Other Ambulatory Visit: Payer: Self-pay | Admitting: Gynecologic Oncology

## 2019-09-02 VITALS — BP 114/63 | HR 72 | Temp 97.8°F | Resp 16 | Ht 65.0 in | Wt 209.2 lb

## 2019-09-02 DIAGNOSIS — E669 Obesity, unspecified: Secondary | ICD-10-CM

## 2019-09-02 DIAGNOSIS — Z833 Family history of diabetes mellitus: Secondary | ICD-10-CM | POA: Diagnosis not present

## 2019-09-02 DIAGNOSIS — Z791 Long term (current) use of non-steroidal anti-inflammatories (NSAID): Secondary | ICD-10-CM | POA: Insufficient documentation

## 2019-09-02 DIAGNOSIS — C541 Malignant neoplasm of endometrium: Secondary | ICD-10-CM

## 2019-09-02 DIAGNOSIS — Z7982 Long term (current) use of aspirin: Secondary | ICD-10-CM

## 2019-09-02 DIAGNOSIS — E785 Hyperlipidemia, unspecified: Secondary | ICD-10-CM | POA: Diagnosis not present

## 2019-09-02 DIAGNOSIS — I10 Essential (primary) hypertension: Secondary | ICD-10-CM

## 2019-09-02 DIAGNOSIS — Z7984 Long term (current) use of oral hypoglycemic drugs: Secondary | ICD-10-CM

## 2019-09-02 DIAGNOSIS — E7800 Pure hypercholesterolemia, unspecified: Secondary | ICD-10-CM | POA: Diagnosis not present

## 2019-09-02 DIAGNOSIS — Z79899 Other long term (current) drug therapy: Secondary | ICD-10-CM | POA: Diagnosis not present

## 2019-09-02 DIAGNOSIS — Z8349 Family history of other endocrine, nutritional and metabolic diseases: Secondary | ICD-10-CM

## 2019-09-02 DIAGNOSIS — E119 Type 2 diabetes mellitus without complications: Secondary | ICD-10-CM | POA: Diagnosis not present

## 2019-09-02 DIAGNOSIS — Z8249 Family history of ischemic heart disease and other diseases of the circulatory system: Secondary | ICD-10-CM | POA: Insufficient documentation

## 2019-09-02 MED ORDER — SENNOSIDES-DOCUSATE SODIUM 8.6-50 MG PO TABS
2.0000 | ORAL_TABLET | Freq: Every day | ORAL | 0 refills | Status: DC
Start: 2019-09-02 — End: 2022-10-16

## 2019-09-02 MED ORDER — IBUPROFEN 600 MG PO TABS: 600.0000 mg | ORAL_TABLET | Freq: Three times a day (TID) | ORAL | 0 refills | Status: DC | PRN

## 2019-09-02 MED ORDER — TRAMADOL HCL 50 MG PO TABS: 50.0000 mg | ORAL_TABLET | Freq: Four times a day (QID) | ORAL | 0 refills | Status: DC | PRN

## 2019-09-02 NOTE — Patient Instructions (Signed)
Preparing for your Surgery  Plan for surgery on September 11, 2019 with Dr. Jeral Pinch at [place] will be scheduled for a robotic assisted total laparoscopic hysterectomy (removal of uterus and cervix), bilateral salpingo-oophorectomy (removal of both fallopian tubes and ovaries), sentinel lymph node biopsy, possible laparotomy (bigger incision), possible lymph node dissection.   Pre-operative Testing -You will receive a phone call from presurgical testing at [facility] to arrange for a pre-operative appointment over the phone, lab appointment, and COVID test. The COVID test normally occurs 3 days prior to the surgery and they ask that you self quarantine yourself until the time of surgery so there is a lower chance of exposure.  -Bring your insurance card, copy of an advanced directive if applicable, medication list  -At that visit, you will be asked to sign a consent for a possible blood transfusion in case a transfusion becomes necessary during surgery.  The need for a blood transfusion is rare but having consent is a necessary part of your care.   -You can continue taking your baby aspirin up until the day before surgery.  -Do not take supplements such as fish oil (omega 3), red yeast rice, tumeric before your surgery.   Day Before Surgery at [place] will be asked to take in a light diet the day before surgery.  Avoid carbonated beverages.  You will be advised to have nothing to eat or drink after midnight the evening before.    Eat a light diet the day before surgery.  Examples including soups, broths, toast, yogurt, mashed potatoes.  Things to avoid include carbonated beverages (fizzy beverages), raw fruits and raw vegetables, or beans.   If your bowels are filled with gas, your surgeon will have difficulty visualizing your pelvic organs which increases your surgical risks.  Your role in recovery Your role is to become active as soon as directed by your  doctor, while still giving yourself time to heal.  Rest when you feel tired. You will be asked to do the following in order to speed your recovery:  - Cough and breathe deeply. This helps to clear and expand your lungs and can prevent pneumonia after surgery.  - [place]. Do mild physical activity. Walking or moving your legs help your circulation and body functions return to normal. Do not try to get up or walk alone the first time after surgery.   -If you develop swelling on one leg or the other, pain in the back of your leg, redness/warmth in one of your legs, please call the office or go to the Emergency Room to have a doppler to rule out a blood clot. For shortness of breath, chest pain-seek care in the Emergency Room as soon as possible. - Actively manage your pain. Managing your pain lets you move in comfort. We will ask you to rate your pain on a scale of zero to 10. It is your responsibility to tell your doctor or nurse where and how much you hurt so your pain can be treated.  Special Considerations -If you are diabetic, you may be placed on insulin after surgery to have closer control over your blood sugars to promote healing and recovery.  This does not mean that you will be discharged on insulin.  If applicable, your oral antidiabetics will be resumed when you are tolerating a solid diet.  -Your final pathology results from surgery should be available around one week after surgery and  the results will be relayed to you when available.  -FMLA forms can be faxed to [phone] and please allow 5-7 business days for completion.  Pain Management After Surgery -You have been prescribed your pain medication and bowel regimen medications before surgery so that you can have these available when you are discharged from the hospital. The pain medication is for use ONLY AFTER surgery and a new prescription will not be given.   -Make sure that you have Tylenol and Ibuprofen at  home to use on a regular basis after surgery for pain control. We recommend alternating the medications every hour to six hours since they work differently and are processed in the body differently for pain relief.  -Review the attached handout on narcotic use and their risks and side effects.   Bowel Regimen -You have been prescribed Sennakot-S to take nightly to prevent constipation especially if you are taking the narcotic pain medication intermittently.  It is important to prevent constipation and drink adequate amounts of liquids. You can stop taking this medication when you are not taking pain medication and you are back on your normal bowel routine.   Blood Transfusion Information (For the consent to be signed before surgery)  We will be checking your blood type before surgery so in case of emergencies, we will know what type of blood you would need.                                            WHAT IS A BLOOD TRANSFUSION?  A transfusion is the replacement of blood or some of its parts. Blood is made up of multiple cells which provide different functions.  Red blood cells carry oxygen and are used for blood loss replacement.  White blood cells fight against infection.  Platelets control bleeding.  Plasma helps clot blood.  Other blood products are available for specialized needs, such as hemophilia or other clotting disorders. BEFORE THE TRANSFUSION  Who gives blood for transfusions?   You may be able to donate blood to be used at a later date on yourself (autologous donation).  Relatives can be asked to donate blood. This is generally not any safer than if you have received blood from a stranger. The same precautions are taken to ensure safety when a relative's blood is donated.  Healthy volunteers who are fully evaluated to make sure their blood is safe. This is blood bank blood. Transfusion therapy is the safest it has ever been in the practice of medicine. Before blood is  taken from a donor, a complete history is taken to make sure that person has no history of diseases nor engages in risky social behavior (examples are intravenous drug use or sexual activity with multiple partners). The donor's travel history is screened to minimize risk of transmitting infections, such as malaria. The donated blood is tested for signs of infectious diseases, such as HIV and hepatitis. The blood is then tested to be sure it is compatible with you in order to minimize the chance of a transfusion reaction. If you or a relative donates blood, this is often done in anticipation of surgery and is not appropriate for emergency situations. It takes many days to process the donated blood. RISKS AND COMPLICATIONS Although transfusion therapy is very safe and saves many lives, the main dangers of transfusion include:   Getting an infectious disease.  Developing a transfusion reaction. This is an allergic reaction to something in the blood you were given. Every precaution is taken to prevent this. The decision to have a blood transfusion has been considered carefully by your caregiver before blood is given. Blood is not given unless the benefits outweigh the risks.  AFTER SURGERY INSTRUCTIONS  Return to work: 4-6 weeks if applicable  Activity: 1. Be up and out of the bed during the day.  Take a nap if needed.  You may walk up steps but be careful and use the hand rail.  Stair climbing will tire you more than you think, you may need to stop part way and rest.   2. No lifting or straining for 6 weeks over 10 pounds. No pushing, pulling, straining for 6 weeks.  3. No driving for 1 week minimum.  Do not drive if you are taking narcotic pain medicine.   4. You can shower as soon as the next day after surgery. Shower daily.  Use soap and water on your incision and pat dry; don't rub.  No tub baths or submerging your body in water until cleared by your surgeon. If you have the soap that was given  to you by pre-surgical testing that was used before surgery, you do not need to use it afterwards because this can irritate your incisions.   5. No sexual activity and nothing in the vagina for 8 weeks.  6. You may experience a small amount of clear drainage from your incisions, which is normal.  If the drainage persists, increases, or changes color please call the office.  7. Do not use creams, lotions, or ointments such as neosporin on your incisions after surgery until advised by your surgeon because they can cause removal of the dermabond glue on your incisions.    8. You may experience vaginal spotting after surgery or around the 6-8 week mark from surgery when the stitches at the top of the vagina begin to dissolve.  The spotting is normal but if you experience heavy bleeding, call our office.  9. Take Tylenol or ibuprofen first for pain and only use narcotic pain medication for severe pain not relieved by the Tylenol or Ibuprofen.  Monitor your Tylenol intake to a max of 4,000 mg.  Diet: 1. Low sodium Heart Healthy Diet is recommended.  2. It is safe to use a laxative, such as Miralax or Colace, if you have difficulty moving your bowels. You can take Sennakot at bedtime every evening to keep bowel movements regular and to prevent constipation.    Wound Care: 1. Keep clean and dry.  Shower daily.  Reasons to call the Doctor:  Fever - Oral temperature greater than 100.4 degrees Fahrenheit  Foul-smelling vaginal discharge  Difficulty urinating  Nausea and vomiting  Increased pain at the site of the incision that is unrelieved with pain medicine.  Difficulty breathing with or without chest pain  New calf pain especially if only on one side  Sudden, continuing increased vaginal bleeding with or without clots.   Contacts: For questions or concerns you should contact:  Dr. Jeral Pinch at [phone]  Joylene John, NP at [phone]  After Hours: call [phone]  and have the GYN Oncologist paged/contacted

## 2019-09-07 ENCOUNTER — Ambulatory Visit: Payer: BC Managed Care – PPO | Admitting: Gynecologic Oncology

## 2019-09-08 ENCOUNTER — Encounter (HOSPITAL_COMMUNITY)
Admission: RE | Admit: 2019-09-08 | Discharge: 2019-09-08 | Disposition: A | Discharge status: Home / Self Care | Payer: BC Managed Care – PPO | Source: Ambulatory Visit | Attending: Gynecologic Oncology | Admitting: Gynecologic Oncology

## 2019-09-08 ENCOUNTER — Other Ambulatory Visit: Payer: Self-pay

## 2019-09-08 ENCOUNTER — Other Ambulatory Visit (HOSPITAL_COMMUNITY)
Admission: RE | Admit: 2019-09-08 | Discharge: 2019-09-08 | Disposition: A | Discharge status: Home / Self Care | Payer: BC Managed Care – PPO | Source: Ambulatory Visit | Attending: Gynecologic Oncology | Admitting: Gynecologic Oncology

## 2019-09-08 DIAGNOSIS — E119 Type 2 diabetes mellitus without complications: Secondary | ICD-10-CM | POA: Diagnosis not present

## 2019-09-08 DIAGNOSIS — Z20822 Contact with and (suspected) exposure to covid-19: Secondary | ICD-10-CM | POA: Diagnosis not present

## 2019-09-08 DIAGNOSIS — Z01818 Encounter for other preprocedural examination: Secondary | ICD-10-CM | POA: Diagnosis not present

## 2019-09-08 DIAGNOSIS — Z01812 Encounter for preprocedural laboratory examination: Secondary | ICD-10-CM | POA: Diagnosis not present

## 2019-09-08 DIAGNOSIS — C541 Malignant neoplasm of endometrium: Secondary | ICD-10-CM | POA: Diagnosis not present

## 2019-09-08 DIAGNOSIS — Z79899 Other long term (current) drug therapy: Secondary | ICD-10-CM | POA: Diagnosis not present

## 2019-09-08 DIAGNOSIS — Z7984 Long term (current) use of oral hypoglycemic drugs: Secondary | ICD-10-CM | POA: Diagnosis not present

## 2019-09-08 DIAGNOSIS — I10 Essential (primary) hypertension: Secondary | ICD-10-CM | POA: Diagnosis not present

## 2019-09-08 LAB — HEMOGLOBIN A1C
Hgb A1c MFr Bld: 6.6 % — ABNORMAL HIGH (ref 4.8–5.6)
Mean Plasma Glucose: 142.72 mg/dL

## 2019-09-08 LAB — COMPREHENSIVE METABOLIC PANEL
ALT: 23 U/L (ref 0–44)
AST: 25 U/L (ref 15–41)
Albumin: 3.7 g/dL (ref 3.5–5.0)
Alkaline Phosphatase: 53 U/L (ref 38–126)
Anion gap: 9 (ref 5–15)
BUN: 18 mg/dL (ref 8–23)
CO2: 27 mmol/L (ref 22–32)
Calcium: 9.8 mg/dL (ref 8.9–10.3)
Chloride: 103 mmol/L (ref 98–111)
Creatinine, Ser: 0.92 mg/dL (ref 0.44–1.00)
GFR calc Af Amer: >60 mL/min (ref >60)
GFR calc non Af Amer: >60 mL/min (ref >60)
Glucose, Bld: 124 mg/dL — ABNORMAL HIGH (ref 70–99)
Potassium: 4.1 mmol/L (ref 3.5–5.1)
Sodium: 139 mmol/L (ref 135–145)
Total Bilirubin: 0.5 mg/dL (ref 0.3–1.2)
Total Protein: 7.2 g/dL (ref 6.5–8.1)

## 2019-09-08 LAB — CBC
HCT: 46.1 % — ABNORMAL HIGH (ref 36.0–46.0)
Hemoglobin: 14.6 g/dL (ref 12.0–15.0)
MCH: 28.5 pg (ref 26.0–34.0)
MCHC: 31.7 g/dL (ref 30.0–36.0)
MCV: 90 fL (ref 80.0–100.0)
Platelets: 263 10*3/uL (ref 150–400)
RBC: 5.12 MIL/uL — ABNORMAL HIGH (ref 3.87–5.11)
RDW: 13.2 % (ref 11.5–15.5)
WBC: 8.9 10*3/uL (ref 4.0–10.5)
nRBC: 0 % (ref 0.0–0.2)

## 2019-09-08 LAB — URINALYSIS, ROUTINE W REFLEX MICROSCOPIC
Bacteria, UA: NONE SEEN
Bilirubin Urine: NEGATIVE
Glucose, UA: >=500 mg/dL — AB
Ketones, ur: NEGATIVE mg/dL
Nitrite: NEGATIVE
Protein, ur: NEGATIVE mg/dL
Specific Gravity, Urine: 1.024 (ref 1.005–1.030)
pH: 5 (ref 5.0–8.0)

## 2019-09-08 LAB — SARS CORONAVIRUS 2 (TAT 6-24 HRS): SARS Coronavirus 2: NEGATIVE

## 2019-09-08 LAB — GLUCOSE, CAPILLARY: Glucose-Capillary: 113 mg/dL — ABNORMAL HIGH (ref 70–99)

## 2019-09-08 NOTE — Progress Notes (Signed)
DUE TO COVID-19 ONLY ONE VISITOR IS ALLOWED TO COME WITH YOU AND STAY IN THE WAITING ROOM ONLY DURING PRE OP AND PROCEDURE DAY OF SURGERY. THE 1 VISITOR MAY VISIT WITH YOU AFTER SURGERY IN YOUR PRIVATE ROOM DURING VISITING HOURS ONLY!  YOU NEED TO HAVE A COVID 19 TEST ON_______ @_______ , THIS TEST MUST BE DONE BEFORE SURGERY, COME  [place], [place] , [address].  ([place]) ONCE YOUR COVID TEST IS COMPLETED, PLEASE BEGIN THE QUARANTINE INSTRUCTIONS AS OUTLINED IN YOUR HANDOUT.                Tracey Foster  09/08/2019   Your procedure is scheduled on:  09/11/19   Report to [facility] Main  Entrance   Report to admitting at   [place] AM     Call this number if you have problems the morning of surgery [phone]    Remember: Do not eat food   :After Midnight. Eat a light diet the day before surgery.  Avoid gas producing foods.  May have clear liquids from 12 midnite until 0430am of surgery then nothing by mouth.  BRUSH YOUR TEETH MORNING OF SURGERY AND RINSE YOUR MOUTH OUT, NO CHEWING GUM CANDY OR MINTS.     Take these medicines the morning of surgery with A SIP OF WATER:  None  DO NOT TAKE ANY DIABETIC MEDICATIONS DAY OF YOUR SURGERY                               You may not have any metal on your body including hair pins and              piercings  Do not wear jewelry, make-up, lotions, powders or perfumes, deodorant             Do not wear nail polish on your fingernails.  Do not shave  48 hours prior to surgery.              Men may shave face and neck.   Do not bring valuables to the hospital. [place].  Contacts, dentures or bridgework may not be worn into surgery.  Leave suitcase in the car. After surgery it may be brought to your room.     Patients discharged the day of surgery will not be allowed to drive home. IF YOU ARE HAVING SURGERY AND GOING HOME THE SAME DAY, YOU MUST HAVE AN ADULT TO  DRIVE YOU HOME AND BE WITH YOU FOR 24 HOURS. YOU MAY GO HOME BY TAXI OR UBER OR ORTHERWISE, BUT AN ADULT MUST ACCOMPANY YOU HOME AND STAY WITH YOU FOR 24 HOURS.  Name and phone number of your driver:  Special Instructions:               Please read over the following fact sheets you were given: _____________________________________________________________________             [facility] - Preparing for Surgery Before surgery, you can play an important role.  Because skin is not sterile, your skin needs to be as free of germs as possible.  You can reduce the number of germs on your skin by washing with CHG (chlorahexidine gluconate) soap before surgery.  CHG is an antiseptic cleaner which kills germs and bonds with the skin  to continue killing germs even after washing. Please DO NOT use if you have an allergy to CHG or antibacterial soaps.  If your skin becomes reddened/irritated stop using the CHG and inform your nurse when you arrive at Short Stay. Do not shave (including legs and underarms) for at least 48 hours prior to the first CHG shower.  You may shave your face/neck. Please follow these instructions carefully:  1.  Shower with CHG Soap the night before surgery and the  morning of Surgery.  2.  If you choose to wash your hair, wash your hair first as usual with your  normal  shampoo.  3.  After you shampoo, rinse your hair and body thoroughly to remove the  shampoo.                           4.  Use CHG as you would any other liquid soap.  You can apply chg directly  to the skin and wash                       Gently with a scrungie or clean washcloth.  5.  Apply the CHG Soap to your body ONLY FROM THE NECK DOWN.   Do not use on face/ open                           Wound or open sores. Avoid contact with eyes, ears mouth and genitals (private parts).                       Wash face,  Genitals (private parts) with your normal soap.             6.  Wash thoroughly, paying special attention  to the area where your surgery  will be performed.  7.  Thoroughly rinse your body with warm water from the neck down.  8.  DO NOT shower/wash with your normal soap after using and rinsing off  the CHG Soap.                9.  Pat yourself dry with a clean towel.            10.  Wear clean pajamas.            11.  Place clean sheets on your bed the night of your first shower and do not  sleep with pets. Day of Surgery : Do not apply any lotions/deodorants the morning of surgery.  Please wear clean clothes to the hospital/surgery center.  FAILURE TO FOLLOW THESE INSTRUCTIONS MAY RESULT IN THE CANCELLATION OF YOUR SURGERY PATIENT SIGNATURE_________________________________  NURSE SIGNATURE__________________________________  ________________________________________________________________________   [name]  An incentive spirometer is a tool that can help keep your lungs clear and active. This tool measures how well you are filling your lungs with each breath. Taking long deep breaths may help reverse or decrease the chance of developing breathing (pulmonary) problems (especially infection) following:  A long period of time when you are unable to move or be active. BEFORE THE PROCEDURE   If the spirometer includes an indicator to show your best effort, your nurse or respiratory therapist will set it to a desired goal.  If possible, sit up straight or lean slightly forward. Try not to slouch.  Hold the incentive spirometer in an upright position. INSTRUCTIONS FOR USE  1.  Sit on the edge of your bed if possible, or sit up as far as you can in bed or on a chair. 2. Hold the incentive spirometer in an upright position. 3. Breathe out normally. 4. Place the mouthpiece in your mouth and seal your lips tightly around it. 5. Breathe in slowly and as deeply as possible, raising the piston or the ball toward the top of the column. 6. Hold your breath for 3-5 seconds or for as long as  possible. Allow the piston or ball to fall to the bottom of the column. 7. Remove the mouthpiece from your mouth and breathe out normally. 8. Rest for a few seconds and repeat Steps 1 through 7 at least 10 times every 1-2 hours when you are awake. Take your time and take a few normal breaths between deep breaths. 9. The spirometer may include an indicator to show your best effort. Use the indicator as a goal to work toward during each repetition. 10. After each set of 10 deep breaths, practice coughing to be sure your lungs are clear. If you have an incision (the cut made at the time of surgery), support your incision when coughing by placing a pillow or rolled up towels firmly against it. Once you are able to get out of bed, walk around indoors and cough well. You may stop using the incentive spirometer when instructed by your caregiver.  RISKS AND COMPLICATIONS  Take your time so you do not get dizzy or light-headed.  If you are in pain, you may need to take or ask for pain medication before doing incentive spirometry. It is harder to take a deep breath if you are having pain. AFTER USE  Rest and breathe slowly and easily.  It can be helpful to keep track of a log of your progress. Your caregiver can provide you with a simple table to help with this. If you are using the spirometer at home, follow these instructions: [place] IF:   You are having difficultly using the spirometer.  You have trouble using the spirometer as often as instructed.  Your pain medication is not giving enough relief while using the spirometer.  You develop fever of 100.5 F (38.1 C) or higher. SEEK IMMEDIATE MEDICAL CARE IF:   You cough up bloody sputum that had not been present before.  You develop fever of 102 F (38.9 C) or greater.  You develop worsening pain at or near the incision site. MAKE SURE YOU:   Understand these instructions.  Will watch your condition.  Will get help right  away if you are not doing well or get worse. Document Released: 10/01/2006 Document Revised: 08/13/2011 Document Reviewed: 12/02/2006 [facility] Patient Information 2014 [place], [place].   ________________________________________________________________________

## 2019-09-08 NOTE — Progress Notes (Signed)
Lab results faxed to pt's PCP.

## 2019-09-09 NOTE — Progress Notes (Addendum)
PCP - Maury Dus Cardiologist -   Chest x-ray -  EKG - 09-08-19 epic Stress Test -  ECHO -  Cardiac Cath -   Sleep Study -  CPAP -   Fasting Blood Sugar -  113-120's Checks Blood Sugar _1-2 x a week____ times a day  Blood Thinner Instructions: Aspirin Instructions: 81mg   No instructions Last Dose:  Anesthesia review:   Patient denies shortness of breath, fever, cough and chest pain at PAT appointment    none   Patient verbalized understanding of instructions that were given to them at the PAT appointment. Patient was also instructed that they will need to review over the PAT instructions again at home before surgery.

## 2019-09-09 NOTE — Patient Instructions (Signed)
DUE TO COVID-19 ONLY ONE VISITOR IS ALLOWED TO COME WITH YOU AND STAY IN THE WAITING ROOM ONLY DURING PRE OP AND PROCEDURE DAY OF SURGERY. THE 1 VISITOR MAY VISIT WITH YOU AFTER SURGERY IN YOUR PRIVATE ROOM DURING VISITING HOURS ONLY!  YOU NEED TO HAVE A COVID 19 TEST ON_______ @_______ , THIS TEST MUST BE DONE BEFORE SURGERY, COME  [address]y [address], [place] , [address].  ([place]) ONCE YOUR COVID TEST IS COMPLETED, PLEASE BEGIN THE QUARANTINE INSTRUCTIONS AS OUTLINED IN YOUR HANDOUT.                Tracey Foster  09/09/2019   Your procedure is scheduled on:  09/11/19   Report to [facility] Main  Entrance   Report to admitting at   [place] AM     Call this number if you have problems the morning of surgery [phone]    Remember: Do not eat food   :After Midnight. Eat a light diet the day before surgery.  Avoid gas producing foods.  May have clear liquids from 12 midnite until 0430am of surgery then nothing by mouth.  BRUSH YOUR TEETH MORNING OF SURGERY AND RINSE YOUR MOUTH OUT, NO CHEWING GUM CANDY OR MINTS.     Take these medicines the morning of surgery with A SIP OF WATER:  None  DO NOT TAKE ANY DIABETIC MEDICATIONS DAY OF YOUR SURGERY                               You may not have any metal on your body including hair pins and              piercings  Do not wear jewelry, make-up, lotions, powders or perfumes, deodorant             Do not wear nail polish on your fingernails.  Do not shave  48 hours prior to surgery.              Men may shave face and neck.   Do not bring valuables to the hospital. [place].  Contacts, dentures or bridgework may not be worn into surgery.  Leave suitcase in the car. After surgery it may be brought to your room.     Patients discharged the day of surgery will not be allowed to drive home. IF YOU ARE HAVING SURGERY AND GOING HOME THE SAME DAY, YOU MUST HAVE AN ADULT TO  DRIVE YOU HOME AND BE WITH YOU FOR 24 HOURS. YOU MAY GO HOME BY TAXI OR UBER OR ORTHERWISE, BUT AN ADULT MUST ACCOMPANY YOU HOME AND STAY WITH YOU FOR 24 HOURS.  Name and phone number of your driver:  Special Instructions:               Please read over the following fact sheets you were given: _____________________________________________________________________             [facility] - Preparing for Surgery Before surgery, you can play an important role.  Because skin is not sterile, your skin needs to be as free of germs as possible.  You can reduce the number of germs on your skin by washing with CHG (chlorahexidine gluconate) soap before surgery.  CHG is an antiseptic cleaner which kills germs and bonds with the skin  to continue killing germs even after washing. Please DO NOT use if you have an allergy to CHG or antibacterial soaps.  If your skin becomes reddened/irritated stop using the CHG and inform your nurse when you arrive at Short Stay. Do not shave (including legs and underarms) for at least 48 hours prior to the first CHG shower.  You may shave your face/neck. Please follow these instructions carefully:  1.  Shower with CHG Soap the night before surgery and the  morning of Surgery.  2.  If you choose to wash your hair, wash your hair first as usual with your  normal  shampoo.  3.  After you shampoo, rinse your hair and body thoroughly to remove the  shampoo.                           4.  Use CHG as you would any other liquid soap.  You can apply chg directly  to the skin and wash                       Gently with a scrungie or clean washcloth.  5.  Apply the CHG Soap to your body ONLY FROM THE NECK DOWN.   Do not use on face/ open                           Wound or open sores. Avoid contact with eyes, ears mouth and genitals (private parts).                       Wash face,  Genitals (private parts) with your normal soap.             6.  Wash thoroughly, paying special attention  to the area where your surgery  will be performed.  7.  Thoroughly rinse your body with warm water from the neck down.  8.  DO NOT shower/wash with your normal soap after using and rinsing off  the CHG Soap.                9.  Pat yourself dry with a clean towel.            10.  Wear clean pajamas.            11.  Place clean sheets on your bed the night of your first shower and do not  sleep with pets. Day of Surgery : Do not apply any lotions/deodorants the morning of surgery.  Please wear clean clothes to the hospital/surgery center.  FAILURE TO FOLLOW THESE INSTRUCTIONS MAY RESULT IN THE CANCELLATION OF YOUR SURGERY PATIENT SIGNATURE_________________________________  NURSE SIGNATURE__________________________________  ________________________________________________________________________   Tracey Foster  An incentive spirometer is a tool that can help keep your lungs clear and active. This tool measures how well you are filling your lungs with each breath. Taking long deep breaths may help reverse or decrease the chance of developing breathing (pulmonary) problems (especially infection) following:  A long period of time when you are unable to move or be active. BEFORE THE PROCEDURE   If the spirometer includes an indicator to show your best effort, your nurse or respiratory therapist will set it to a desired goal.  If possible, sit up straight or lean slightly forward. Try not to slouch.  Hold the incentive spirometer in an upright position. INSTRUCTIONS FOR USE  1.  Sit on the edge of your bed if possible, or sit up as far as you can in bed or on a chair. 2. Hold the incentive spirometer in an upright position. 3. Breathe out normally. 4. Place the mouthpiece in your mouth and seal your lips tightly around it. 5. Breathe in slowly and as deeply as possible, raising the piston or the ball toward the top of the column. 6. Hold your breath for 3-5 seconds or for as long as  possible. Allow the piston or ball to fall to the bottom of the column. 7. Remove the mouthpiece from your mouth and breathe out normally. 8. Rest for a few seconds and repeat Steps 1 through 7 at least 10 times every 1-2 hours when you are awake. Take your time and take a few normal breaths between deep breaths. 9. The spirometer may include an indicator to show your best effort. Use the indicator as a goal to work toward during each repetition. 10. After each set of 10 deep breaths, practice coughing to be sure your lungs are clear. If you have an incision (the cut made at the time of surgery), support your incision when coughing by placing a pillow or rolled up towels firmly against it. Once you are able to get out of bed, walk around indoors and cough well. You may stop using the incentive spirometer when instructed by your caregiver.  RISKS AND COMPLICATIONS  Take your time so you do not get dizzy or light-headed.  If you are in pain, you may need to take or ask for pain medication before doing incentive spirometry. It is harder to take a deep breath if you are having pain. AFTER USE  Rest and breathe slowly and easily.  It can be helpful to keep track of a log of your progress. Your caregiver can provide you with a simple table to help with this. If you are using the spirometer at home, follow these instructions: [place] IF:   You are having difficultly using the spirometer.  You have trouble using the spirometer as often as instructed.  Your pain medication is not giving enough relief while using the spirometer.  You develop fever of 100.5 F (38.1 C) or higher. SEEK IMMEDIATE MEDICAL CARE IF:   You cough up bloody sputum that had not been present before.  You develop fever of 102 F (38.9 C) or greater.  You develop worsening pain at or near the incision site. MAKE SURE YOU:   Understand these instructions.  Will watch your condition.  Will get help right  away if you are not doing well or get worse. Document Released: 10/01/2006 Document Revised: 08/13/2011 Document Reviewed: 12/02/2006 [facility] Patient Information 2014 [place], [place].   ________________________________________________________________________

## 2019-09-10 ENCOUNTER — Telehealth: Payer: Self-pay

## 2019-09-10 ENCOUNTER — Other Ambulatory Visit: Payer: Self-pay

## 2019-09-10 ENCOUNTER — Encounter (HOSPITAL_COMMUNITY): Payer: Self-pay | Admitting: Gynecologic Oncology

## 2019-09-10 ENCOUNTER — Encounter (HOSPITAL_COMMUNITY)
Admission: RE | Admit: 2019-09-10 | Discharge: 2019-09-10 | Disposition: A | Discharge status: Home / Self Care | Payer: BC Managed Care – PPO | Source: Ambulatory Visit | Attending: Gynecologic Oncology | Admitting: Gynecologic Oncology

## 2019-09-10 ENCOUNTER — Encounter (HOSPITAL_COMMUNITY): Payer: Self-pay

## 2019-09-10 DIAGNOSIS — C541 Malignant neoplasm of endometrium: Secondary | ICD-10-CM | POA: Diagnosis not present

## 2019-09-10 DIAGNOSIS — Z01818 Encounter for other preprocedural examination: Secondary | ICD-10-CM | POA: Insufficient documentation

## 2019-09-10 DIAGNOSIS — E119 Type 2 diabetes mellitus without complications: Secondary | ICD-10-CM | POA: Insufficient documentation

## 2019-09-10 DIAGNOSIS — Z79899 Other long term (current) drug therapy: Secondary | ICD-10-CM | POA: Diagnosis not present

## 2019-09-10 DIAGNOSIS — Z7984 Long term (current) use of oral hypoglycemic drugs: Secondary | ICD-10-CM | POA: Diagnosis not present

## 2019-09-10 DIAGNOSIS — I10 Essential (primary) hypertension: Secondary | ICD-10-CM | POA: Insufficient documentation

## 2019-09-10 NOTE — Anesthesia Preprocedure Evaluation (Addendum)
Anesthesia Evaluation  Patient identified by MRN, date of birth, ID band Patient awake    Reviewed: Allergy & Precautions, NPO status , Patient's Chart, lab work & pertinent test results  History of Anesthesia Complications Negative for: history of anesthetic complications  Airway Mallampati: III  TM Distance: >3 FB Neck ROM: Full    Dental no notable dental hx. (+) Dental Advisory Given   Pulmonary neg pulmonary ROS,    Pulmonary exam normal        Cardiovascular hypertension, negative cardio ROS Normal cardiovascular exam     Neuro/Psych negative neurological ROS     GI/Hepatic negative GI ROS, Neg liver ROS,   Endo/Other  diabetes  Renal/GU negative Renal ROS     Musculoskeletal negative musculoskeletal ROS (+)   Abdominal   Peds  Hematology negative hematology ROS (+)   Anesthesia Other Findings Day of surgery medications reviewed with the patient.  Reproductive/Obstetrics Endometrial cancer                             Anesthesia Physical Anesthesia Plan  ASA: III  Anesthesia Plan: General   Post-op Pain Management:    Induction: Intravenous  PONV Risk Score and Plan: 4 or greater and Ondansetron, Dexamethasone, Midazolam and Scopolamine patch - Pre-op  Airway Management Planned: Oral ETT  Additional Equipment:   Intra-op Plan:   Post-operative Plan: Extubation in OR  Informed Consent: I have reviewed the patients History and Physical, chart, labs and discussed the procedure including the risks, benefits and alternatives for the proposed anesthesia with the patient or authorized representative who has indicated his/her understanding and acceptance.     Dental advisory given  Plan Discussed with: Anesthesiologist and CRNA  Anesthesia Plan Comments:        Anesthesia Quick Evaluation

## 2019-09-10 NOTE — Telephone Encounter (Signed)
Ms Vaid stated that she understands her pre op instructions and has no questions or concerns at this time.

## 2019-09-11 ENCOUNTER — Ambulatory Visit (HOSPITAL_COMMUNITY)
Admission: RE | Admit: 2019-09-11 | Discharge: 2019-09-11 | Disposition: A | Discharge status: Home / Self Care | Payer: BC Managed Care – PPO | Source: Other Acute Inpatient Hospital | Attending: Gynecologic Oncology | Admitting: Gynecologic Oncology

## 2019-09-11 ENCOUNTER — Encounter (HOSPITAL_COMMUNITY): Payer: Self-pay | Admitting: Gynecologic Oncology

## 2019-09-11 ENCOUNTER — Ambulatory Visit (HOSPITAL_COMMUNITY): Payer: BC Managed Care – PPO | Admitting: Physician Assistant

## 2019-09-11 ENCOUNTER — Other Ambulatory Visit: Payer: Self-pay

## 2019-09-11 ENCOUNTER — Ambulatory Visit (HOSPITAL_COMMUNITY): Payer: BC Managed Care – PPO | Admitting: Anesthesiology

## 2019-09-11 ENCOUNTER — Encounter (HOSPITAL_COMMUNITY)
Admission: RE | Disposition: A | Discharge status: Home / Self Care | Payer: Self-pay | Source: Other Acute Inpatient Hospital | Attending: Gynecologic Oncology

## 2019-09-11 DIAGNOSIS — Z880 Allergy status to penicillin: Secondary | ICD-10-CM | POA: Insufficient documentation

## 2019-09-11 DIAGNOSIS — Z833 Family history of diabetes mellitus: Secondary | ICD-10-CM | POA: Insufficient documentation

## 2019-09-11 DIAGNOSIS — N950 Postmenopausal bleeding: Secondary | ICD-10-CM | POA: Insufficient documentation

## 2019-09-11 DIAGNOSIS — E7800 Pure hypercholesterolemia, unspecified: Secondary | ICD-10-CM | POA: Insufficient documentation

## 2019-09-11 DIAGNOSIS — Z6834 Body mass index (BMI) 34.0-34.9, adult: Secondary | ICD-10-CM | POA: Insufficient documentation

## 2019-09-11 DIAGNOSIS — Z791 Long term (current) use of non-steroidal anti-inflammatories (NSAID): Secondary | ICD-10-CM | POA: Diagnosis not present

## 2019-09-11 DIAGNOSIS — E785 Hyperlipidemia, unspecified: Secondary | ICD-10-CM | POA: Diagnosis not present

## 2019-09-11 DIAGNOSIS — Z7984 Long term (current) use of oral hypoglycemic drugs: Secondary | ICD-10-CM | POA: Diagnosis not present

## 2019-09-11 DIAGNOSIS — Z96653 Presence of artificial knee joint, bilateral: Secondary | ICD-10-CM | POA: Diagnosis not present

## 2019-09-11 DIAGNOSIS — C541 Malignant neoplasm of endometrium: Secondary | ICD-10-CM | POA: Insufficient documentation

## 2019-09-11 DIAGNOSIS — Z8249 Family history of ischemic heart disease and other diseases of the circulatory system: Secondary | ICD-10-CM | POA: Diagnosis not present

## 2019-09-11 DIAGNOSIS — Z7982 Long term (current) use of aspirin: Secondary | ICD-10-CM | POA: Insufficient documentation

## 2019-09-11 DIAGNOSIS — Z79899 Other long term (current) drug therapy: Secondary | ICD-10-CM | POA: Insufficient documentation

## 2019-09-11 DIAGNOSIS — I10 Essential (primary) hypertension: Secondary | ICD-10-CM | POA: Diagnosis not present

## 2019-09-11 DIAGNOSIS — E119 Type 2 diabetes mellitus without complications: Secondary | ICD-10-CM | POA: Diagnosis not present

## 2019-09-11 DIAGNOSIS — Z8349 Family history of other endocrine, nutritional and metabolic diseases: Secondary | ICD-10-CM | POA: Diagnosis not present

## 2019-09-11 DIAGNOSIS — E669 Obesity, unspecified: Secondary | ICD-10-CM | POA: Diagnosis not present

## 2019-09-11 HISTORY — PX: SENTINEL NODE BIOPSY: SHX6608

## 2019-09-11 HISTORY — PX: ROBOTIC ASSISTED TOTAL HYSTERECTOMY WITH BILATERAL SALPINGO OOPHERECTOMY: SHX6086

## 2019-09-11 LAB — GLUCOSE, CAPILLARY
Glucose-Capillary: 115 mg/dL — ABNORMAL HIGH (ref 70–99)
Glucose-Capillary: 144 mg/dL — ABNORMAL HIGH (ref 70–99)

## 2019-09-11 LAB — TYPE AND SCREEN
ABO/RH(D): O POS
Antibody Screen: NEGATIVE

## 2019-09-11 SURGERY — HYSTERECTOMY, TOTAL, ROBOT-ASSISTED, LAPAROSCOPIC, WITH BILATERAL SALPINGO-OOPHORECTOMY
Anesthesia: General

## 2019-09-11 MED ORDER — ONDANSETRON HCL 4 MG/2ML IJ SOLN
INTRAMUSCULAR | Status: DC | PRN
  Administered 2019-09-11 (×2): 4 mg via INTRAVENOUS

## 2019-09-11 MED ORDER — EPHEDRINE SULFATE-NACL 50-0.9 MG/10ML-% IV SOSY
PREFILLED_SYRINGE | INTRAVENOUS | Status: DC | PRN
  Administered 2019-09-11: 5 mg via INTRAVENOUS
  Administered 2019-09-11: 10 mg via INTRAVENOUS
  Administered 2019-09-11: 20 mg via INTRAVENOUS

## 2019-09-11 MED ORDER — CIPROFLOXACIN IN D5W 400 MG/200ML IV SOLN
400.0000 mg | INTRAVENOUS | Status: AC
  Administered 2019-09-11: 400 mg via INTRAVENOUS
  Filled 2019-09-11: qty 200

## 2019-09-11 MED ORDER — PHENYLEPHRINE 40 MCG/ML (10ML) SYRINGE FOR IV PUSH (FOR BLOOD PRESSURE SUPPORT)
PREFILLED_SYRINGE | INTRAVENOUS | Status: DC | PRN
  Administered 2019-09-11: 80 ug via INTRAVENOUS
  Administered 2019-09-11: 120 ug via INTRAVENOUS

## 2019-09-11 MED ORDER — LIDOCAINE 20MG/ML (2%) 15 ML SYRINGE OPTIME
INTRAMUSCULAR | Status: DC | PRN
  Administered 2019-09-11: 1 mg/kg/h via INTRAVENOUS

## 2019-09-11 MED ORDER — CLINDAMYCIN PHOSPHATE 900 MG/50ML IV SOLN
900.0000 mg | INTRAVENOUS | Status: AC
  Administered 2019-09-11: 08:00:00 900 mg via INTRAVENOUS
  Filled 2019-09-11: qty 50

## 2019-09-11 MED ORDER — MIDAZOLAM HCL 2 MG/2ML IJ SOLN
INTRAMUSCULAR | Status: AC
  Filled 2019-09-11: qty 2

## 2019-09-11 MED ORDER — SCOPOLAMINE 1 MG/3DAYS TD PT72: 1.0000 | MEDICATED_PATCH | TRANSDERMAL | Status: DC

## 2019-09-11 MED ORDER — LIDOCAINE HCL 2 % IJ SOLN
INTRAMUSCULAR | Status: AC
  Filled 2019-09-11: qty 20

## 2019-09-11 MED ORDER — ACETAMINOPHEN 500 MG PO TABS: 1000.0000 mg | ORAL_TABLET | ORAL | Status: DC

## 2019-09-11 MED ORDER — CELECOXIB 200 MG PO CAPS
200.0000 mg | ORAL_CAPSULE | Freq: Once | ORAL | Status: AC
  Administered 2019-09-11: 06:00:00 200 mg via ORAL
  Filled 2019-09-11: qty 1

## 2019-09-11 MED ORDER — SUCCINYLCHOLINE CHLORIDE 200 MG/10ML IV SOSY
PREFILLED_SYRINGE | INTRAVENOUS | Status: DC | PRN
  Administered 2019-09-11: 140 mg via INTRAVENOUS

## 2019-09-11 MED ORDER — LIDOCAINE 2% (20 MG/ML) 5 ML SYRINGE
INTRAMUSCULAR | Status: AC
  Filled 2019-09-11: qty 5

## 2019-09-11 MED ORDER — PHENYLEPHRINE HCL-NACL 10-0.9 MG/250ML-% IV SOLN
INTRAVENOUS | Status: DC | PRN
  Administered 2019-09-11: 25 ug/min via INTRAVENOUS

## 2019-09-11 MED ORDER — KETAMINE HCL 10 MG/ML IJ SOLN
INTRAMUSCULAR | Status: AC
  Filled 2019-09-11: qty 1

## 2019-09-11 MED ORDER — BUPIVACAINE HCL 0.25 % IJ SOLN
INTRAMUSCULAR | Status: AC
  Filled 2019-09-11: qty 1

## 2019-09-11 MED ORDER — MIDAZOLAM HCL 2 MG/2ML IJ SOLN
INTRAMUSCULAR | Status: DC | PRN
  Administered 2019-09-11: 2 mg via INTRAVENOUS

## 2019-09-11 MED ORDER — ROCURONIUM BROMIDE 10 MG/ML (PF) SYRINGE
PREFILLED_SYRINGE | INTRAVENOUS | Status: DC | PRN
  Administered 2019-09-11: 10 mg via INTRAVENOUS
  Administered 2019-09-11: 60 mg via INTRAVENOUS

## 2019-09-11 MED ORDER — LIDOCAINE 2% (20 MG/ML) 5 ML SYRINGE
INTRAMUSCULAR | Status: DC | PRN
  Administered 2019-09-11: 100 mg via INTRAVENOUS

## 2019-09-11 MED ORDER — DEXAMETHASONE SODIUM PHOSPHATE 10 MG/ML IJ SOLN
INTRAMUSCULAR | Status: DC | PRN
  Administered 2019-09-11: 4 mg via INTRAVENOUS

## 2019-09-11 MED ORDER — PROPOFOL 10 MG/ML IV BOLUS
INTRAVENOUS | Status: DC | PRN
  Administered 2019-09-11: 150 mg via INTRAVENOUS

## 2019-09-11 MED ORDER — FENTANYL CITRATE (PF) 250 MCG/5ML IJ SOLN
INTRAMUSCULAR | Status: AC
  Filled 2019-09-11: qty 5

## 2019-09-11 MED ORDER — FENTANYL CITRATE (PF) 100 MCG/2ML IJ SOLN: 25.0000 ug | INTRAMUSCULAR | Status: DC | PRN

## 2019-09-11 MED ORDER — GABAPENTIN 100 MG PO CAPS
200.0000 mg | ORAL_CAPSULE | ORAL | Status: AC
  Administered 2019-09-11: 06:00:00 200 mg via ORAL
  Filled 2019-09-11: qty 2

## 2019-09-11 MED ORDER — BUPIVACAINE HCL 0.25 % IJ SOLN
INTRAMUSCULAR | Status: DC | PRN
  Administered 2019-09-11: 50 mL

## 2019-09-11 MED ORDER — KETAMINE HCL 10 MG/ML IJ SOLN
INTRAMUSCULAR | Status: DC | PRN
  Administered 2019-09-11: 20 mg via INTRAVENOUS

## 2019-09-11 MED ORDER — LACTATED RINGERS IR SOLN
Status: DC | PRN
  Administered 2019-09-11: 300 mL

## 2019-09-11 MED ORDER — FENTANYL CITRATE (PF) 250 MCG/5ML IJ SOLN
INTRAMUSCULAR | Status: DC | PRN
  Administered 2019-09-11 (×3): 50 ug via INTRAVENOUS
  Administered 2019-09-11: 100 ug via INTRAVENOUS

## 2019-09-11 MED ORDER — PROPOFOL 10 MG/ML IV BOLUS
INTRAVENOUS | Status: AC
  Filled 2019-09-11: qty 20

## 2019-09-11 MED ORDER — ALBUMIN HUMAN 5 % IV SOLN: INTRAVENOUS | Status: DC | PRN

## 2019-09-11 MED ORDER — STERILE WATER FOR INJECTION IJ SOLN
INTRAMUSCULAR | Status: AC
  Filled 2019-09-11: qty 10

## 2019-09-11 MED ORDER — LACTATED RINGERS IV SOLN: INTRAVENOUS | Status: DC

## 2019-09-11 MED ORDER — DEXAMETHASONE SODIUM PHOSPHATE 4 MG/ML IJ SOLN: 4.0000 mg | INTRAMUSCULAR | Status: DC

## 2019-09-11 MED ORDER — SUGAMMADEX SODIUM 500 MG/5ML IV SOLN
INTRAVENOUS | Status: AC
  Filled 2019-09-11: qty 5

## 2019-09-11 MED ORDER — SCOPOLAMINE 1 MG/3DAYS TD PT72
1.0000 | MEDICATED_PATCH | TRANSDERMAL | Status: DC
  Administered 2019-09-11: 06:00:00 1.5 mg via TRANSDERMAL
  Filled 2019-09-11: qty 1

## 2019-09-11 MED ORDER — CELECOXIB 200 MG PO CAPS: 400.0000 mg | ORAL_CAPSULE | ORAL | Status: DC

## 2019-09-11 MED ORDER — SUGAMMADEX SODIUM 500 MG/5ML IV SOLN
INTRAVENOUS | Status: DC | PRN
  Administered 2019-09-11: 300 mg via INTRAVENOUS

## 2019-09-11 MED ORDER — PROMETHAZINE HCL 25 MG/ML IJ SOLN: 6.2500 mg | INTRAMUSCULAR | Status: DC | PRN

## 2019-09-11 MED ORDER — HEPARIN SODIUM (PORCINE) 5000 UNIT/ML IJ SOLN
5000.0000 [IU] | INTRAMUSCULAR | Status: AC
  Administered 2019-09-11: 5000 [IU] via SUBCUTANEOUS
  Filled 2019-09-11: qty 1

## 2019-09-11 MED ORDER — ACETAMINOPHEN 500 MG PO TABS
1000.0000 mg | ORAL_TABLET | Freq: Once | ORAL | Status: AC
  Administered 2019-09-11: 06:00:00 1000 mg via ORAL
  Filled 2019-09-11: qty 2

## 2019-09-11 SURGICAL SUPPLY — 64 items
APPLICATOR SURGIFLO ENDO (HEMOSTASIS) IMPLANT
BACTOSHIELD CHG 4% 4OZ (MISCELLANEOUS) ×1
BAG LAPAROSCOPIC 12 15 PORT 16 (BASKET) IMPLANT
BAG RETRIEVAL 12/15 (BASKET)
BLADE SURG SZ10 CARB STEEL (BLADE) IMPLANT
COVER BACK TABLE 60X90IN (DRAPES) ×3 IMPLANT
COVER TIP SHEARS 8 DVNC (MISCELLANEOUS) ×2 IMPLANT
COVER TIP SHEARS 8MM DA VINCI (MISCELLANEOUS) ×3
COVER WAND RF STERILE (DRAPES) IMPLANT
DECANTER SPIKE VIAL GLASS SM (MISCELLANEOUS) ×6 IMPLANT
DERMABOND ADVANCED (GAUZE/BANDAGES/DRESSINGS) ×1
DERMABOND ADVANCED .7 DNX12 (GAUZE/BANDAGES/DRESSINGS) ×2 IMPLANT
DRAPE ARM DVNC X/XI (DISPOSABLE) ×8 IMPLANT
DRAPE COLUMN DVNC XI (DISPOSABLE) ×2 IMPLANT
DRAPE DA VINCI XI ARM (DISPOSABLE) ×12
DRAPE DA VINCI XI COLUMN (DISPOSABLE) ×3
DRAPE SHEET LG 3/4 BI-LAMINATE (DRAPES) ×3 IMPLANT
DRAPE SURG IRRIG POUCH 19X23 (DRAPES) ×3 IMPLANT
DRSG OPSITE POSTOP 4X6 (GAUZE/BANDAGES/DRESSINGS) IMPLANT
DRSG OPSITE POSTOP 4X8 (GAUZE/BANDAGES/DRESSINGS) IMPLANT
ELECT REM PT RETURN 15FT ADLT (MISCELLANEOUS) ×3 IMPLANT
GLOVE BIO SURGEON STRL SZ 6 (GLOVE) ×12 IMPLANT
GLOVE BIO SURGEON STRL SZ 6.5 (GLOVE) ×6 IMPLANT
GOWN STRL REUS W/ TWL LRG LVL3 (GOWN DISPOSABLE) ×8 IMPLANT
GOWN STRL REUS W/TWL LRG LVL3 (GOWN DISPOSABLE) ×12
HOLDER FOLEY CATH W/STRAP (MISCELLANEOUS) IMPLANT
IRRIG SUCT STRYKERFLOW 2 WTIP (MISCELLANEOUS) ×3
IRRIGATION SUCT STRKRFLW 2 WTP (MISCELLANEOUS) ×2 IMPLANT
KIT PROCEDURE DA VINCI SI (MISCELLANEOUS) ×3
KIT PROCEDURE DVNC SI (MISCELLANEOUS) ×2 IMPLANT
KIT TURNOVER KIT A (KITS) ×3 IMPLANT
MANIPULATOR UTERINE 4.5 ZUMI (MISCELLANEOUS) ×3 IMPLANT
NEEDLE HYPO 21X1.5 SAFETY (NEEDLE) ×3 IMPLANT
NEEDLE SPNL 18GX3.5 QUINCKE PK (NEEDLE) ×3 IMPLANT
OBTURATOR OPTICAL STANDARD 8MM (TROCAR) ×3
OBTURATOR OPTICAL STND 8 DVNC (TROCAR) ×2
OBTURATOR OPTICALSTD 8 DVNC (TROCAR) ×2 IMPLANT
PACK ROBOT GYN CUSTOM WL (TRAY / TRAY PROCEDURE) ×3 IMPLANT
PAD POSITIONING PINK XL (MISCELLANEOUS) ×3 IMPLANT
PENCIL SMOKE EVACUATOR (MISCELLANEOUS) IMPLANT
PORT ACCESS TROCAR AIRSEAL 12 (TROCAR) ×2 IMPLANT
PORT ACCESS TROCAR AIRSEAL 5M (TROCAR) ×1
POUCH SPECIMEN RETRIEVAL 10MM (ENDOMECHANICALS) IMPLANT
SCRUB CHG 4% DYNA-HEX 4OZ (MISCELLANEOUS) ×2 IMPLANT
SEAL CANN UNIV 5-8 DVNC XI (MISCELLANEOUS) ×6 IMPLANT
SEAL XI 5MM-8MM UNIVERSAL (MISCELLANEOUS) ×9
SET TRI-LUMEN FLTR TB AIRSEAL (TUBING) ×3 IMPLANT
SPONGE LAP 18X18 RF (DISPOSABLE) IMPLANT
SURGIFLO W/THROMBIN 8M KIT (HEMOSTASIS) IMPLANT
SUT MNCRL AB 4-0 PS2 18 (SUTURE) IMPLANT
SUT PDS AB 1 TP1 96 (SUTURE) IMPLANT
SUT VIC AB 0 CT1 27 (SUTURE)
SUT VIC AB 0 CT1 27XBRD ANTBC (SUTURE) IMPLANT
SUT VIC AB 2-0 CT1 27 (SUTURE)
SUT VIC AB 2-0 CT1 TAPERPNT 27 (SUTURE) IMPLANT
SUT VICRYL 4-0 PS2 18IN ABS (SUTURE) ×6 IMPLANT
SYR 10ML LL (SYRINGE) ×3 IMPLANT
TOWEL OR NON WOVEN STRL DISP B (DISPOSABLE) ×3 IMPLANT
TRAP SPECIMEN MUCUS 40CC (MISCELLANEOUS) IMPLANT
TRAY FOLEY MTR SLVR 14FR STAT (SET/KITS/TRAYS/PACK) ×3 IMPLANT
TROCAR XCEL NON-BLD 5MMX100MML (ENDOMECHANICALS) IMPLANT
UNDERPAD 30X36 HEAVY ABSORB (UNDERPADS AND DIAPERS) ×3 IMPLANT
WATER STERILE IRR 1000ML POUR (IV SOLUTION) ×3 IMPLANT
YANKAUER SUCT BULB TIP 10FT TU (MISCELLANEOUS) IMPLANT

## 2019-09-11 NOTE — Discharge Instructions (Signed)
09/11/2019  Return to work: 4-6 weeks if applicable  Do not start taking aspirin until 3 days after surgery.  Activity: 1. Be up and out of the bed during the day.  Take a nap if needed.  You may walk up steps but be careful and use the hand rail.  Stair climbing will tire you more than you think, you may need to stop part way and rest.   2. No lifting or straining for 6 weeks.  3. No driving for 1 week(s).  Do not drive if you are taking narcotic pain medicine.  4. Shower daily.  Use soap and water on your incision and pat dry; don't rub.  No tub baths until cleared by your surgeon.   5. No sexual activity and nothing in the vagina for 8 weeks.  6. You may experience a small amount of clear drainage from your incisions, which is normal.  If the drainage persists or increases, please call the office.  7. You may experience vaginal spotting after surgery or around the 6-8 week mark from surgery when the stitches at the top of the vagina begin to dissolve.  The spotting is normal but if you experience heavy bleeding, call our office.  8. Take Tylenol or ibuprofen first for pain and only use narcotic pain medication for severe pain not relieved by the Tylenol or Ibuprofen.  Monitor your Tylenol intake to a max of 4,000 mg.  Diet: 1. Low sodium Heart Healthy Diet is recommended.  2. It is safe to use a laxative, such as Miralax or Colace, if you have difficulty moving your bowels. You can take Sennakot at bedtime every evening to keep bowel movements regular and to prevent constipation.    Wound Care: 1. Keep clean and dry.  Shower daily.  Reasons to call the Doctor:  Fever - Oral temperature greater than 100.4 degrees Fahrenheit  Foul-smelling vaginal discharge  Difficulty urinating  Nausea and vomiting  Increased pain at the site of the incision that is unrelieved with pain medicine.  Difficulty breathing with or without chest pain  New calf pain especially if only on one  side  Sudden, continuing increased vaginal bleeding with or without clots.   Contacts: For questions or concerns you should contact:  Dr. Jeral Pinch at [phone]  Joylene John, NP at [phone]  After Hours: call [phone] and have the GYN Oncologist paged/contacted

## 2019-09-11 NOTE — Op Note (Signed)
OPERATIVE NOTE  Pre-operative Diagnosis: endometrial cancer grade 1  Post-operative Diagnosis: same as above  Operation: Robotic-assisted laparoscopic total hysterectomy with bilateral salpingoophorectomy, SLN biopsy   Surgeon: Jeral Pinch MD  Assistant Surgeon: Joylene John NP  Anesthesia: GET  Urine Output: 125cc clear yellow urine  Operative Findings: On EUA, 10cm mobile uterus. On intra-abdominal entry, normal appearing liver edge, diaphragm, stomach, small and large bowel.No intra-abdominal or pelvic evidence of disease. Uterus 10cm, bulbous fundus. Normal appearing adnexa. Mapping successful to bilateral obturator SLNs.  Estimated Blood Loss:  50cc      Total IV Fluids: 1250 ml         Specimens: uterus, cervix, bilateral tubes and ovaries, bilateral SLNs         Complications:  None apparent; patient tolerated the procedure well.         Disposition: PACU - hemodynamically stable.  Procedure Details  The patient was seen in the Holding Room. The risks, benefits, complications, treatment options, and expected outcomes were discussed with the patient.  The patient concurred with the proposed plan, giving informed consent.  The site of surgery properly noted/marked. The patient was identified as Tracey Foster and the procedure verified as a Robotic-assisted hysterectomy with bilateral salpingo oophorectomy with SLN biopsy.   After induction of anesthesia, the patient was draped and prepped in the usual sterile manner. Patient was placed in supine position after anesthesia and draped and prepped in the usual sterile manner as follows: Her arms were tucked to her side with all appropriate precautions.  The shoulders were stabilized with padded shoulder blocks applied to the acromium processes.  The patient was placed in the semi-lithotomy position in [place].  The perineum and vagina were prepped with CholoraPrep. The patient was draped after the CholoraPrep had been  allowed to dry for 3 minutes.  A Time Out was held and the above information confirmed.  The urethra was prepped with Betadine. Foley catheter was placed.  A sterile speculum was placed in the vagina.  The cervix was grasped with a single-tooth tenaculum. 2mg  total of ICG was injected into the cervical stroma at 2 and 9 o'clock with 1cc injected at a 1cm and 73mm depth (concentration 0.5mg /ml) in all locations. The cervix was dilated with Kennon Rounds dilators.  The ZUMI uterine manipulator with a medium colpotomizer ring was placed without difficulty.  OG tube placement was confirmed and to suction.   Next, a 10 mm skin incision was made 1 cm below the subcostal margin in the midclavicular line.  The 5 mm Optiview port and scope was used for direct entry.  Opening pressure was under 10 mm CO2.  The abdomen was insufflated and the findings were noted as above.   At this point and all points during the procedure, the patient's intra-abdominal pressure did not exceed 15 mmHg. Next, an 8 mm skin incision was made superior to the umbilicus and a right and left port were placed about 8 cm lateral to the robot port on the right and left side.  A fourth arm was placed on the right.  The 5 mm assist trocar was exchanged for a 10-12 mm port. All ports were placed under direct visualization.  The patient was placed in steep Trendelenburg.  Bowel was folded away into the upper abdomen.  The robot was docked in the normal manner.  The right and left peritoneum were opened parallel to the IP ligament to open the retroperitoneal spaces bilaterally. The round ligaments were transected.  The SLN mapping was performed in bilateral pelvic basins. After identifying the ureters, the para rectal and paravesical spaces were opened up entirely with careful dissection below the level of the ureters bilaterally and to the depth of the uterine artery origin in order to skeletonize the uterine "web" and ensure visualization of all parametrial  channels. The para-aortic basins were carefully exposed and evaluated for isolated para-aortic SLN's. Lymphatic channels were identified travelling to the following visualized sentinel lymph node's: right and left obturator. These SLN's were separated from their surrounding lymphatic tissue, removed and sent for permanent pathology.  The hysterectomy was started.  The ureter was again noted to be on the medial leaf of the broad ligament.  The peritoneum above the ureter was incised and stretched and the infundibulopelvic ligament was skeletonized, cauterized and cut.  The posterior peritoneum was taken down to the level of the KOH ring.  The anterior peritoneum was also taken down.  The bladder flap was created to the level of the KOH ring.  The uterine artery on the right side was skeletonized, cauterized and cut in the normal manner.  A similar procedure was performed on the left.  The colpotomy was made and the uterus, cervix, bilateral ovaries and tubes were amputated and delivered through the vagina.  Pedicles were inspected and excellent hemostasis was achieved.    The colpotomy at the vaginal cuff was closed with Vicryl on a CT1 needle in a running manner.  Irrigation was used and excellent hemostasis was achieved.  At this point in the procedure was completed.  Robotic instruments were removed under direct visulaization.  The robot was undocked. The fascia at the 10-12 mm port was closed with 0 Vicryl on a UR-5 needle.  The subcuticular tissue was closed with 4-0 Vicryl and the skin was closed with 4-0 Monocryl in a subcuticular manner.  Dermabond was applied.    The vagina was swabbed with  minimal bleeding noted.  Foley catheter was removed. All sponge, lap and needle counts were correct x  3.   The patient was transferred to the recovery room in stable condition.  Jeral Pinch, MD

## 2019-09-11 NOTE — Anesthesia Postprocedure Evaluation (Signed)
Anesthesia Post Note  Patient: Tracey Foster  Procedure(s) Performed: XI ROBOTIC ASSISTED TOTAL HYSTERECTOMY WITH BILATERAL SALPINGO OOPHORECTOMY (Bilateral ) SENTINEL NODE BIOPSY (N/A )     Patient location during evaluation: PACU Anesthesia Type: General Level of consciousness: awake and alert Pain management: pain level controlled Vital Signs Assessment: post-procedure vital signs reviewed and stable Respiratory status: spontaneous breathing, nonlabored ventilation, respiratory function stable and patient connected to nasal cannula oxygen Cardiovascular status: blood pressure returned to baseline and stable Postop Assessment: no apparent nausea or vomiting Anesthetic complications: no    Last Vitals:  Vitals:   09/11/19 1145 09/11/19 1200  BP: 120/62 (!) 122/58  Pulse: 75 77  Resp: 18 16  Temp:    SpO2: 96% 96%    Last Pain:  Vitals:   09/11/19 1200  TempSrc:   PainSc: 0-No pain                 Kayon Dozier DANIEL

## 2019-09-11 NOTE — Interval H&P Note (Signed)
History and Physical Interval Note:  09/11/2019 6:58 AM  Tracey Foster  has presented today for surgery, with the diagnosis of ENDOMETRIAL CANCER.  The various methods of treatment have been discussed with the patient and family. After consideration of risks, benefits and other options for treatment, the patient has consented to  Procedure(s): XI ROBOTIC ASSISTED TOTAL HYSTERECTOMY WITH BILATERAL SALPINGO OOPHORECTOMY (Bilateral) SENTINEL NODE BIOPSY (N/A) POSSIBLE LYMPH NODE DISSECTION (N/A) POSSIBLE LAPAROTOMY (N/A) as a surgical intervention.  The patient's history has been reviewed, patient examined, no change in status, stable for surgery.  I have reviewed the patient's chart and labs.  Questions were answered to the patient's satisfaction.     Tracey Foster

## 2019-09-11 NOTE — Anesthesia Procedure Notes (Signed)
Procedure Name: Intubation Date/Time: 09/11/2019 7:30 AM Performed by: Cynda Familia, CRNA Pre-anesthesia Checklist: Patient identified, Emergency Drugs available, Suction available and Patient being monitored Patient Re-evaluated:Patient Re-evaluated prior to induction Oxygen Delivery Method: Circle System Utilized Preoxygenation: Pre-oxygenation with 100% oxygen Induction Type: IV induction Ventilation: Mask ventilation without difficulty Laryngoscope Size: Miller and 2 Tube type: Oral Number of attempts: 1 Airway Equipment and Method: Stylet Placement Confirmation: ETT inserted through vocal cords under direct vision,  positive ETCO2 and breath sounds checked- equal and bilateral Secured at: 22 cm Tube secured with: Tape Dental Injury: Teeth and Oropharynx as per pre-operative assessment  Comments: Smooth IV induction- Singer-- intubation AM CRNA atraumatic-- teeth and mouth as preop-- pt anterior consider Glidescope intubation-- small mouth-- bilat BS Tracey Foster

## 2019-09-11 NOTE — Transfer of Care (Signed)
Immediate Anesthesia Transfer of Care Note  Patient: Tracey Foster  Procedure(s) Performed: XI ROBOTIC ASSISTED TOTAL HYSTERECTOMY WITH BILATERAL SALPINGO OOPHORECTOMY (Bilateral ) SENTINEL NODE BIOPSY (N/A )  Patient Location: PACU  Anesthesia Type:General  Level of Consciousness: sedated  Airway & Oxygen Therapy: Patient Spontanous Breathing and Patient connected to face mask oxygen  Post-op Assessment: Report given to RN and Post -op Vital signs reviewed and stable  Post vital signs: Reviewed and stable  Last Vitals:  Vitals Value Taken Time  BP 109/54 09/11/19 1010  Temp    Pulse 75 09/11/19 1012  Resp 21 09/11/19 1012  SpO2 98 % 09/11/19 1012  Vitals shown include unvalidated device data.  Last Pain:  Vitals:   09/11/19 0605  TempSrc: Oral  PainSc:          Complications: No apparent anesthesia complications

## 2019-09-11 NOTE — Anesthesia Procedure Notes (Signed)
Date/Time: 09/11/2019 10:04 AM Performed by: Cynda Familia, CRNA Oxygen Delivery Method: Simple face mask Placement Confirmation: positive ETCO2 and breath sounds checked- equal and bilateral Dental Injury: Teeth and Oropharynx as per pre-operative assessment

## 2019-09-14 ENCOUNTER — Telehealth: Payer: Self-pay

## 2019-09-14 NOTE — Telephone Encounter (Signed)
Ms Balyeat states that she is eating, drinking, and urinating well. She has had several BM. Will decrease the senokot-s to one tablet daily or prn. Afebrile. Incisions are D&I. Pt aware of post op appointments and the office number [phone] to call if she has any questions or concerns.

## 2019-09-16 ENCOUNTER — Inpatient Hospital Stay: Payer: BC Managed Care – PPO | Attending: Gynecologic Oncology | Admitting: Gynecologic Oncology

## 2019-09-16 DIAGNOSIS — Z90722 Acquired absence of ovaries, bilateral: Secondary | ICD-10-CM | POA: Insufficient documentation

## 2019-09-16 DIAGNOSIS — C541 Malignant neoplasm of endometrium: Secondary | ICD-10-CM

## 2019-09-16 DIAGNOSIS — Z90710 Acquired absence of both cervix and uterus: Secondary | ICD-10-CM | POA: Insufficient documentation

## 2019-09-16 NOTE — Progress Notes (Signed)
Gynecologic Oncology Telehealth Consult Note: Gyn-Onc  I connected with Tracey Foster on 09/16/19 at 11:30 AM EDT by telephone and verified that I am speaking with the correct person using two identifiers.  I discussed the limitations, risks, security and privacy concerns of performing an evaluation and management service by telemedicine and the availability of in-person appointments. I also discussed with the patient that there may be a patient responsible charge related to this service. The patient expressed understanding and agreed to proceed.  Other persons participating in the visit and their role in the encounter: none.  Patient's location: Home Provider's location: [facility]  Reason for Visit: Postoperative follow-up and discussion of treatment  Treatment History: Oncology History  Endometrial cancer (HCC)  08/19/2019 Initial Biopsy   EMB - gr1 EMCA   08/19/2019 Initial Diagnosis   Endometrial cancer (HCC)   09/11/2019 Surgery   TRH/BSO, SLN   09/11/2019 Pathologic Stage   IA, grade 1 endometrioid, SLNs negative, no LVSI     Interval History: Patient is overall doing very well since surgery.  She endorses minimal pain.  She is voiding without difficulty and reports return of bowel function starting Sunday.  She is eating and drinking without any nausea or.  She denies any fevers or chills.  Denies vaginal bleeding or discharge.  Past Medical/Surgical History: Past Medical History:  Diagnosis Date  . Diabetes mellitus without complication (HCC)    type 2  . Endometrial cancer (HCC)   . High cholesterol   . Hypertension   . Obesity (BMI 30.0-34.9)     Past Surgical History:  Procedure Laterality Date  . COLONOSCOPY    . ROBOTIC ASSISTED TOTAL HYSTERECTOMY WITH BILATERAL SALPINGO OOPHERECTOMY Bilateral 09/11/2019   Procedure: XI ROBOTIC ASSISTED TOTAL HYSTERECTOMY WITH BILATERAL SALPINGO OOPHORECTOMY;  Surgeon: Tucker, Katherine R, MD;  Location: [facility];   Service: Gynecology;  Laterality: Bilateral;  . SENTINEL NODE BIOPSY N/A 09/11/2019   Procedure: SENTINEL NODE BIOPSY;  Surgeon: Tucker, Katherine R, MD;  Location: [facility];  Service: Gynecology;  Laterality: N/A;  . TOTAL KNEE ARTHROPLASTY Right 11/11/2012   Procedure: RIGHT TOTAL KNEE ARTHROPLASTY;  Surgeon: Matthew D Olin, MD;  Location: [facility];  Service: Orthopedics;  Laterality: Right;  . TOTAL KNEE ARTHROPLASTY Left 12/09/2012   Procedure: LEFT TOTAL KNEE ARTHROPLASTY;  Surgeon: Matthew D Olin, MD;  Location: [facility];  Service: Orthopedics;  Laterality: Left;  . WISDOM TOOTH EXTRACTION      Family History  Problem Relation Age of Onset  . Hypertension Mother   . Hyperlipidemia Mother   . Diabetes Father   . Hypertension Father   . Hypertension Paternal Grandmother   . Diabetes Paternal Grandmother   . Heart attack Paternal Grandmother   . Hypertension Paternal Grandfather   . Colon cancer Neg Hx   . Uterine cancer Neg Hx   . Breast cancer Neg Hx   . Ovarian cancer Neg Hx     Social History   Socioeconomic History  . Marital status: Divorced    Spouse name: Not on file  . Number of children: Not on file  . Years of education: Not on file  . Highest education level: Not on file  Occupational History  . Not on file  Tobacco Use  . Smoking status: Never Smoker  . Smokeless tobacco: Never Used  Substance and Sexual Activity  . Alcohol use: No  . Drug use: No  . Sexual activity: Not Currently  Other Topics Concern  .   Not on file  Social History Narrative  . Not on file   Social Determinants of Health   Financial Resource Strain:   . Difficulty of Paying Living Expenses:   Food Insecurity:   . Worried About Running Out of Food in the Last Year:   . Ran Out of Food in the Last Year:   Transportation Needs:   . Lack of Transportation (Medical):   . Lack of Transportation (Non-Medical):   Physical Activity:   . Days of Exercise per Week:   . Minutes of Exercise per  Session:   Stress:   . Feeling of Stress :   Social Connections:   . Frequency of Communication with Friends and Family:   . Frequency of Social Gatherings with Friends and Family:   . Attends Religious Services:   . Active Member of Clubs or Organizations:   . Attends Club or Organization Meetings:   . Marital Status:     Current Medications:  Current Outpatient Medications:  .  Cholecalciferol (VITAMIN D3) 50 MCG (2000 UT) TABS, Take 2,000 Units by mouth daily., Disp: , Rfl:  .  clindamycin (CLEOCIN) 300 MG capsule, Take 900 mg by mouth See admin instructions. TAKE 3 CAPSULES (900 MG) BY MOUTH 1 HOUR PRIOR TO DENTAL APPOINTMENTS, Disp: , Rfl:  .  ezetimibe-simvastatin (VYTORIN) 10-40 MG per tablet, Take 1 tablet by mouth at bedtime., Disp: , Rfl:  .  ibuprofen (ADVIL) 600 MG tablet, Take 1 tablet (600 mg total) by mouth every 8 (eight) hours as needed for mild pain. For AFTER surgery, Disp: 30 tablet, Rfl: 0 .  JARDIANCE 25 MG TABS tablet, Take 25 mg by mouth daily., Disp: , Rfl:  .  lisinopril-hydrochlorothiazide (ZESTORETIC) 20-12.5 MG tablet, Take 1 tablet by mouth daily., Disp: , Rfl:  .  metFORMIN (GLUCOPHAGE) 1000 MG tablet, Take 1,000 mg by mouth 2 (two) times daily., Disp: , Rfl:  .  OZEMPIC, 0.25 OR 0.5 MG/DOSE, 2 MG/1.5ML SOPN, Inject 0.5 mg into the skin every Sunday. , Disp: , Rfl:  .  senna-docusate (SENOKOT-S) 8.6-50 MG tablet, Take 2 tablets by mouth at bedtime. For AFTER surgery, do not take if having diarrhea, Disp: 30 tablet, Rfl: 0 .  traMADol (ULTRAM) 50 MG tablet, Take 1 tablet (50 mg total) by mouth every 6 (six) hours as needed for severe pain. For AFTER surgery, do not take and drive, Disp: 10 tablet, Rfl: 0  Review of Symptoms: Negative.  Physical Exam: There were no vitals taken for this visit. Not performed given limitations of phone visit.  Laboratory & Radiologic Studies: A. SENITNEL LYMPH NODE, RIGHT OBTURATOR, BIOPSY:  - One lymph node, negative  for carcinoma (0/1).   B. SENTINEL LYMPH NODE, LEFT OBTURATOR, BIOPSY:  - One lymph node, negative for carcinoma (0/1).   C. UTERUS, CERVIX AND BILATERAL FALLOPAIN TUBES AND OVARIES,  HYSTERECTOMY AND  BILATERAL SALPINGO-OOPHORECTOMY:  - Endometrioid carcinoma, FIGO grade 1, with invasion less than half of  the myometrium.  - No involvement of uterine serosa, cervical stroma or adnexa.  - See oncology table.   ONCOLOGY TABLE:   UTERUS, CARCINOMA OR CARCINOSARCOMA   Procedure: Total hysterectomy and bilateral salpingo-oophorectomy  Histologic Type: Endometrioid carcinoma  Histologic Grade: FIGO grade 1  Myometrial Invasion: Yes    Depth of invasion: 2 mm    Myometrial thickness: 25 mm  Uterine Serosa Involvement: Not identified  Cervical Stromal Involvement: Not identified  Other Tissue/Organ Involvement: Not identified  Lymphovascular Invasion: Not identified    Regional Lymph Nodes:  Examined: 2 Sentinel                 0 Non-sentinel                 2 Total       Lymph nodes with metastasis: 0    Isolated tumor cells (<0.2 mm): 0       Micrometastasis (>0.2 mm and < 2.0 mm): 0       Macrometastasis (>2.0 mm): 0       Extracapsular extension: Not applicable  Representative Tumor Block: C9  MMR/MSI testing: Will be ordered  Pathologic Stage Classification (pTNM, AJCC 8th edition): pT1a, pN0  Comments: Pancytokeratin performed on the sentinel lymph nodes is  negative.  (v4.1.0.2)   Assessment & Plan: Tracey Foster is a 64 y.o. 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  Patient is meeting postoperative milestones.  She was very happy to hear the news of her final pathology.  Discussed that she is low risk and thus does not need any adjuvant treatment.  We will discuss further recommendations as well as surveillance guidelines at her in person  visit.  I discussed the assessment and treatment plan with the patient. The patient was provided with an opportunity to ask questions and all were answered. The patient agreed with the plan and demonstrated an understanding of the instructions.   The patient was advised to call back or see an in-person evaluation if the symptoms worsen or if the condition fails to improve as anticipated.   15 minutes of total time was spent for this patient encounter, including preparation, face-to-face counseling with the patient and coordination of care, and documentation of the encounter.   Jeral Pinch, MD  Division of Gynecologic Oncology  Department of Obstetrics and Gynecology  [facility]

## 2019-09-17 ENCOUNTER — Inpatient Hospital Stay: Payer: BC Managed Care – PPO | Admitting: Gynecologic Oncology

## 2019-09-17 LAB — SURGICAL PATHOLOGY

## 2019-09-24 ENCOUNTER — Encounter (HOSPITAL_COMMUNITY): Payer: Self-pay | Admitting: Gynecologic Oncology

## 2019-10-01 NOTE — Progress Notes (Signed)
Gynecologic Oncology Return Clinic Visit  10/02/19  Reason for Visit: post-op follow-up, treatment planning  Treatment History: Oncology History Overview Note  MMR IHC preserved MSI-stable   Endometrial cancer (Tracey Foster)  08/19/2019 Initial Biopsy   EMB - gr1 EMCA   08/19/2019 Initial Diagnosis   Endometrial cancer (Tracey Foster)   09/11/2019 Surgery   TRH/BSO, SLN   09/11/2019 Pathologic Stage   IA, grade 1 endometrioid, SLNs negative, no LVSI     Interval History: Patient is doing very well after surgery.  She denies any fevers or chills.  She endorses a good appetite without nausea or vomiting.  She denies any vaginal bleeding or discharge.  She reports regular bowel and bladder function.  Past Medical/Surgical History: Past Medical History:  Diagnosis Date  . Diabetes mellitus without complication (Tracey Foster)    type 2  . Endometrial cancer (Tracey Foster)   . High cholesterol   . Hypertension   . Obesity (BMI 30.0-34.9)     Past Surgical History:  Procedure Laterality Date  . COLONOSCOPY    . ROBOTIC ASSISTED TOTAL HYSTERECTOMY WITH BILATERAL SALPINGO OOPHERECTOMY Bilateral 09/11/2019   Procedure: XI ROBOTIC ASSISTED TOTAL HYSTERECTOMY WITH BILATERAL SALPINGO OOPHORECTOMY;  Surgeon: Lafonda Mosses, MD;  Location: [facility];  Service: Gynecology;  Laterality: Bilateral;  . SENTINEL NODE BIOPSY N/A 09/11/2019   Procedure: SENTINEL NODE BIOPSY;  Surgeon: Lafonda Mosses, MD;  Location: [facility];  Service: Gynecology;  Laterality: N/A;  . TOTAL KNEE ARTHROPLASTY Right 11/11/2012   Procedure: RIGHT TOTAL KNEE ARTHROPLASTY;  Surgeon: Mauri Pole, MD;  Location: [facility];  Service: Orthopedics;  Laterality: Right;  . TOTAL KNEE ARTHROPLASTY Left 12/09/2012   Procedure: LEFT TOTAL KNEE ARTHROPLASTY;  Surgeon: Mauri Pole, MD;  Location: [facility];  Service: Orthopedics;  Laterality: Left;  . WISDOM TOOTH EXTRACTION      Family History  Problem Relation Age of Onset  . Hypertension Mother   .  Hyperlipidemia Mother   . Diabetes Father   . Hypertension Father   . Hypertension Paternal Grandmother   . Diabetes Paternal Grandmother   . Heart attack Paternal Grandmother   . Hypertension Paternal Grandfather   . Colon cancer Neg Hx   . Uterine cancer Neg Hx   . Breast cancer Neg Hx   . Ovarian cancer Neg Hx     Social History   Socioeconomic History  . Marital status: Divorced    Spouse name: Not on file  . Number of children: Not on file  . Years of education: Not on file  . Highest education level: Not on file  Occupational History  . Not on file  Tobacco Use  . Smoking status: Never Smoker  . Smokeless tobacco: Never Used  Substance and Sexual Activity  . Alcohol use: No  . Drug use: No  . Sexual activity: Not Currently  Other Topics Concern  . Not on file  Social History Narrative  . Not on file   Social Determinants of Health   Financial Resource Strain:   . Difficulty of Paying Living Expenses:   Food Insecurity:   . Worried About Charity fundraiser in the Last Year:   . Arboriculturist in the Last Year:   Transportation Needs:   . Film/video editor (Medical):   Marland Kitchen Lack of Transportation (Non-Medical):   Physical Activity:   . Days of Exercise per Week:   . Minutes of Exercise per Session:   Stress:   . Feeling  of Stress :   Social Connections:   . Frequency of Communication with Friends and Family:   . Frequency of Social Gatherings with Friends and Family:   . Attends Religious Services:   . Active Member of Clubs or Organizations:   . Attends Archivist Meetings:   Marland Kitchen Marital Status:     Current Medications:  Current Outpatient Medications:  .  Cholecalciferol (VITAMIN D3) 50 MCG (2000 UT) TABS, Take 2,000 Units by mouth daily., Disp: , Rfl:  .  clindamycin (CLEOCIN) 300 MG capsule, Take 900 mg by mouth See admin instructions. TAKE 3 CAPSULES (900 MG) BY MOUTH 1 HOUR PRIOR TO DENTAL APPOINTMENTS, Disp: , Rfl:  .   ezetimibe-simvastatin (VYTORIN) 10-40 MG per tablet, Take 1 tablet by mouth at bedtime., Disp: , Rfl:  .  ibuprofen (ADVIL) 600 MG tablet, Take 1 tablet (600 mg total) by mouth every 8 (eight) hours as needed for mild pain. For AFTER surgery, Disp: 30 tablet, Rfl: 0 .  JARDIANCE 25 MG TABS tablet, Take 25 mg by mouth daily., Disp: , Rfl:  .  lisinopril-hydrochlorothiazide (ZESTORETIC) 20-12.5 MG tablet, Take 1 tablet by mouth daily., Disp: , Rfl:  .  metFORMIN (GLUCOPHAGE) 1000 MG tablet, Take 1,000 mg by mouth 2 (two) times daily., Disp: , Rfl:  .  OZEMPIC, 0.25 OR 0.5 MG/DOSE, 2 MG/1.5ML SOPN, Inject 0.5 mg into the skin every Sunday. , Disp: , Rfl:  .  senna-docusate (SENOKOT-S) 8.6-50 MG tablet, Take 2 tablets by mouth at bedtime. For AFTER surgery, do not take if having diarrhea, Disp: 30 tablet, Rfl: 0 .  traMADol (ULTRAM) 50 MG tablet, Take 1 tablet (50 mg total) by mouth every 6 (six) hours as needed for severe pain. For AFTER surgery, do not take and drive, Disp: 10 tablet, Rfl: 0  Review of Systems: Denies appetite changes, fevers, chills, fatigue, unexplained weight changes. Denies hearing loss, neck lumps or masses, mouth sores, ringing in ears or voice changes. Denies cough or wheezing.  Denies shortness of breath. Denies chest pain or palpitations. Denies leg swelling. Denies abdominal distention, pain, blood in stools, constipation, diarrhea, nausea, vomiting, or early satiety. Denies pain with intercourse, dysuria, frequency, hematuria or incontinence. Denies hot flashes, pelvic pain, vaginal bleeding or vaginal discharge.   Denies joint pain, back pain or muscle pain/cramps. Denies itching, rash, or wounds. Denies dizziness, headaches, numbness or seizures. Denies swollen lymph nodes or glands, denies easy bruising or bleeding. Denies anxiety, depression, confusion, or decreased concentration.  Physical Exam: BP 96/65 (BP Location: Right Arm, Patient Position: Sitting)    Pulse 89   Temp 98.3 F (36.8 C) (Temporal)   Resp 18   Ht '5\' 5"'  (1.651 m)   Wt 204 lb 2 oz (92.6 kg)   SpO2 95%   BMI 33.97 kg/m  General: Alert, oriented, no acute distress. HEENT: Posterior oropharynx clear, sclera anicteric. Chest: Unlabored breathing on room air. Abdomen: Obese, soft, nontender.  Normoactive bowel sounds.  No masses or hepatosplenomegaly appreciated.  Well-healing surgical incisions. Extremities: Grossly normal range of motion.  Warm, well perfused.  No edema bilaterally. Skin: No rashes or lesions noted. GU: Normal appearing external genitalia without erythema, excoriation, or lesions.  Speculum exam reveals cuff intact, some suture still visible, no active bleeding.  Bimanual exam reveals cuff intact, no pain or fluctuance.    Laboratory & Radiologic Studies: A. SENITNEL LYMPH NODE, RIGHT OBTURATOR, BIOPSY:  - One lymph node, negative for carcinoma (0/1).   B.  SENTINEL LYMPH NODE, LEFT OBTURATOR, BIOPSY:  - One lymph node, negative for carcinoma (0/1).   C. UTERUS, CERVIX AND BILATERAL FALLOPAIN TUBES AND OVARIES,  HYSTERECTOMY AND  BILATERAL SALPINGO-OOPHORECTOMY:  - Endometrioid carcinoma, FIGO grade 1, with invasion less than half of  the myometrium.  - No involvement of uterine serosa, cervical stroma or adnexa.  - See oncology table.   ONCOLOGY TABLE:   UTERUS, CARCINOMA OR CARCINOSARCOMA   Procedure: Total hysterectomy and bilateral salpingo-oophorectomy  Histologic Type: Endometrioid carcinoma  Histologic Grade: FIGO grade 1  Myometrial Invasion: Yes    Depth of invasion: 2 mm    Myometrial thickness: 25 mm  Uterine Serosa Involvement: Not identified  Cervical Stromal Involvement: Not identified  Other Tissue/Organ Involvement: Not identified  Lymphovascular Invasion: Not identified  Regional Lymph Nodes:  Examined: 2 Sentinel                 0 Non-sentinel                 2 Total       Lymph  nodes with metastasis: 0    Isolated tumor cells (<0.2 mm): 0       Micrometastasis (>0.2 mm and < 2.0 mm): 0       Macrometastasis (>2.0 mm): 0       Extracapsular extension: Not applicable  Representative Tumor Block: C9  MMR/MSI testing: Will be ordered  Pathologic Stage Classification (pTNM, AJCC 8th edition): pT1a, pN0  Comments: Pancytokeratin performed on the sentinel lymph nodes is  negative.  (v4.1.0.2)   IHC for MMR - normal, preserved nuclear expression MSI - stable  Assessment & Plan: Tracey Foster is a 64 y.o. woman with Stage IA grade 1 low risk endometrial adenocarcinoma who presents for postoperative follow-up.  The patient is healing very well from surgery.  We discussed continued activity limitation and expectations for the rest of her recovery.  Reviewed her pathology again as well as MSI status.  Per SGO surveillance recommendations, I will see the patient every 6 months for the first year and then yearly.  We discussed that while the risk of recurrence is low, it would most commonly occur at the vaginal cuff.  I reviewed signs and symptoms that should prompt a call prior to her next scheduled visit such as vaginal bleeding or pelvic pain.  15 minutes of total time was spent for this patient encounter, including preparation, face-to-face counseling with the patient and coordination of care, and documentation of the encounter.  Jeral Pinch, MD  Division of Gynecologic Oncology  Department of Obstetrics and Gynecology  [facility]

## 2019-10-02 ENCOUNTER — Inpatient Hospital Stay (HOSPITAL_BASED_OUTPATIENT_CLINIC_OR_DEPARTMENT_OTHER): Payer: BC Managed Care – PPO | Admitting: Gynecologic Oncology

## 2019-10-02 ENCOUNTER — Other Ambulatory Visit: Payer: Self-pay

## 2019-10-02 ENCOUNTER — Encounter: Payer: Self-pay | Admitting: Gynecologic Oncology

## 2019-10-02 VITALS — BP 96/65 | HR 89 | Temp 98.3°F | Resp 18 | Ht 65.0 in | Wt 204.1 lb

## 2019-10-02 DIAGNOSIS — C541 Malignant neoplasm of endometrium: Secondary | ICD-10-CM

## 2019-10-02 DIAGNOSIS — Z90722 Acquired absence of ovaries, bilateral: Secondary | ICD-10-CM

## 2019-10-02 DIAGNOSIS — Z90710 Acquired absence of both cervix and uterus: Secondary | ICD-10-CM

## 2019-10-02 NOTE — Patient Instructions (Signed)
You are healing very well from surgery!  Please call back in June to get your 49-month visit with me scheduled.  If you develop any new symptoms in the meantime, including vaginal bleeding or pelvic pain, please call the clinic at [phone] to be seen sooner.

## 2019-11-03 DIAGNOSIS — D1801 Hemangioma of skin and subcutaneous tissue: Secondary | ICD-10-CM | POA: Diagnosis not present

## 2019-11-03 DIAGNOSIS — D485 Neoplasm of uncertain behavior of skin: Secondary | ICD-10-CM | POA: Diagnosis not present

## 2019-11-03 DIAGNOSIS — D179 Benign lipomatous neoplasm, unspecified: Secondary | ICD-10-CM | POA: Diagnosis not present

## 2019-12-12 IMAGING — MG DIGITAL SCREENING BILAT W/ CAD
4 series · 4 of 4 positions shown · non-contrast
Comparison: Previous exam(s).

CLINICAL DATA: Screening.

EXAM:
DIGITAL SCREENING BILATERAL MAMMOGRAM WITH CAD

[R MLO]
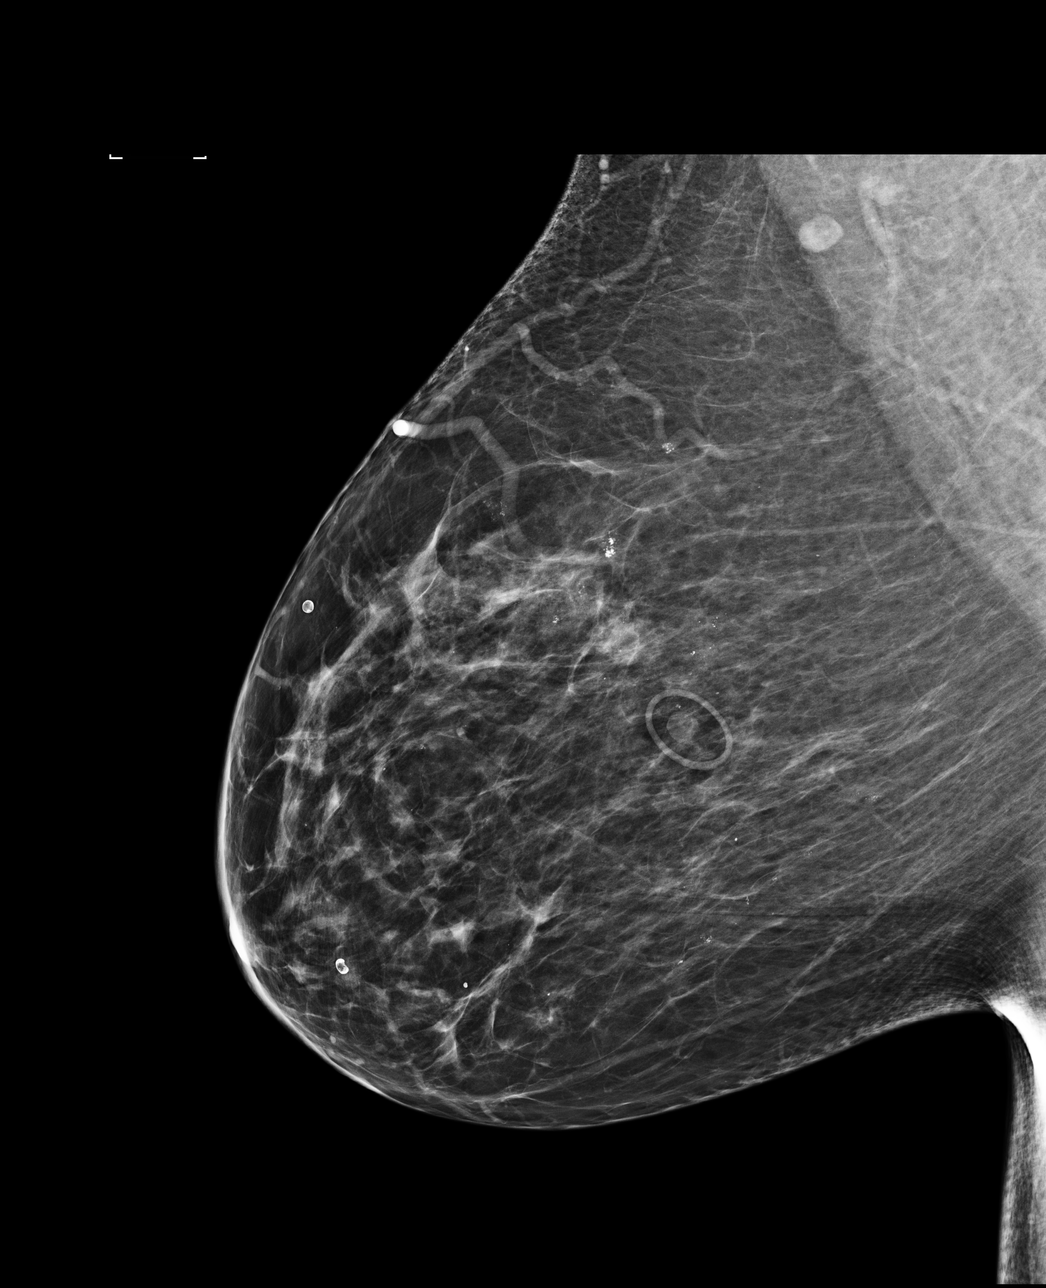

[L CC]
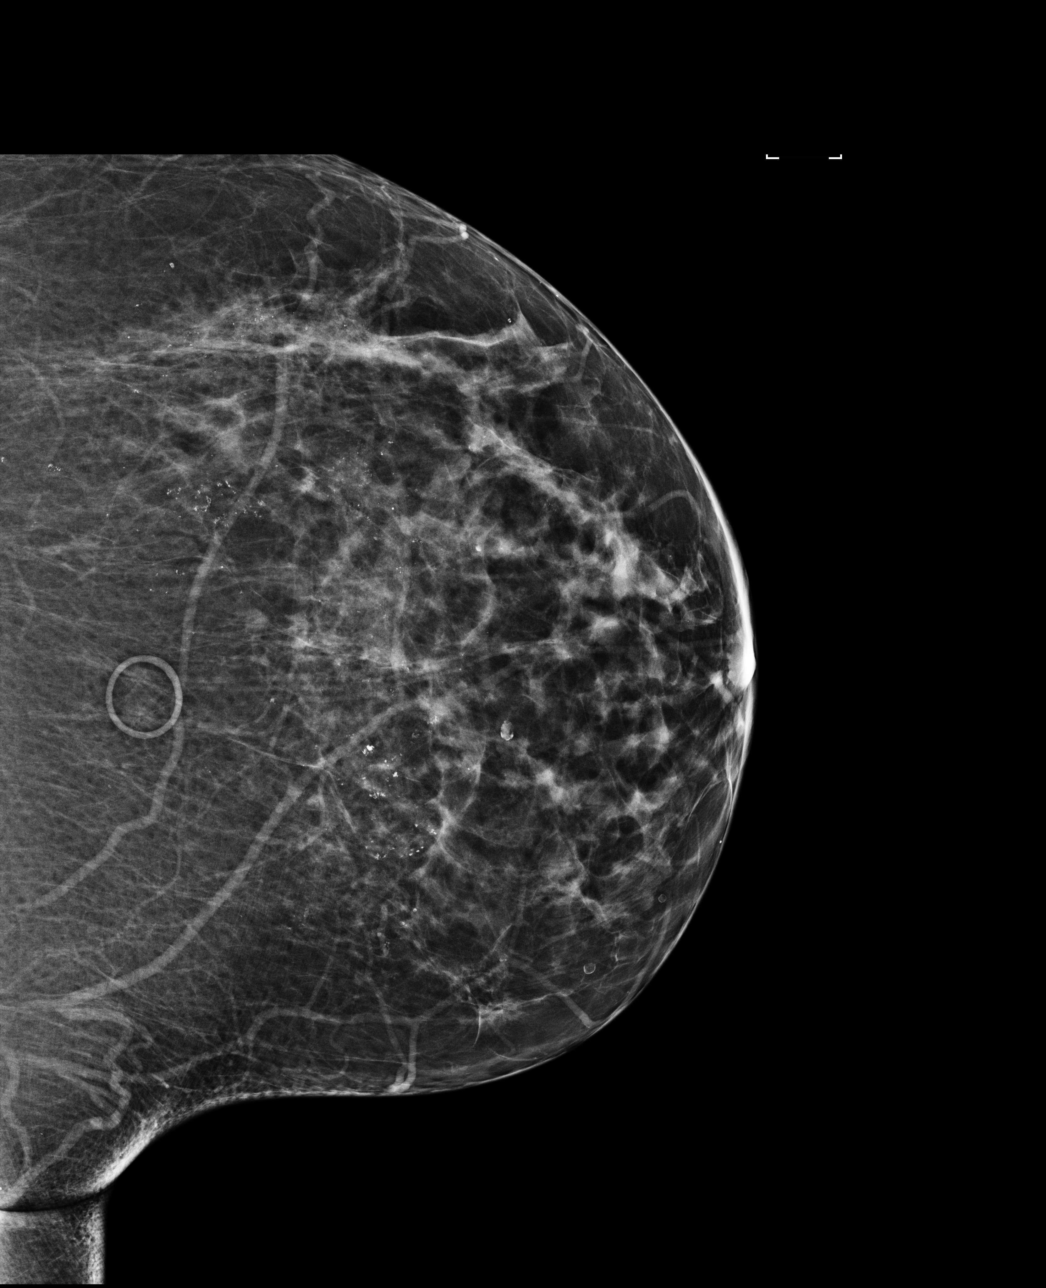

[L MLO]
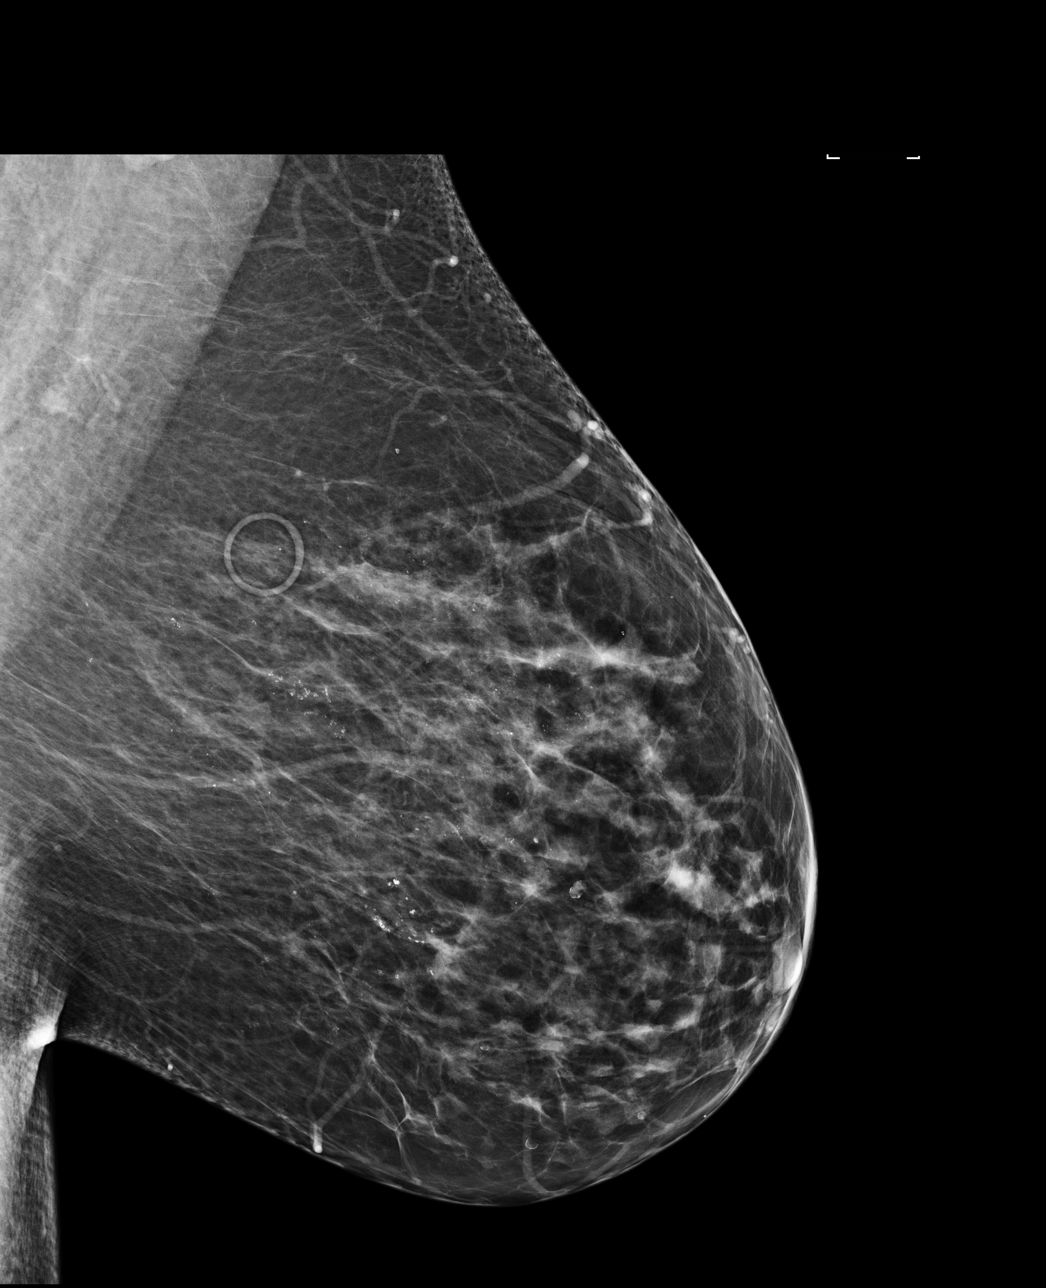

[R CC]
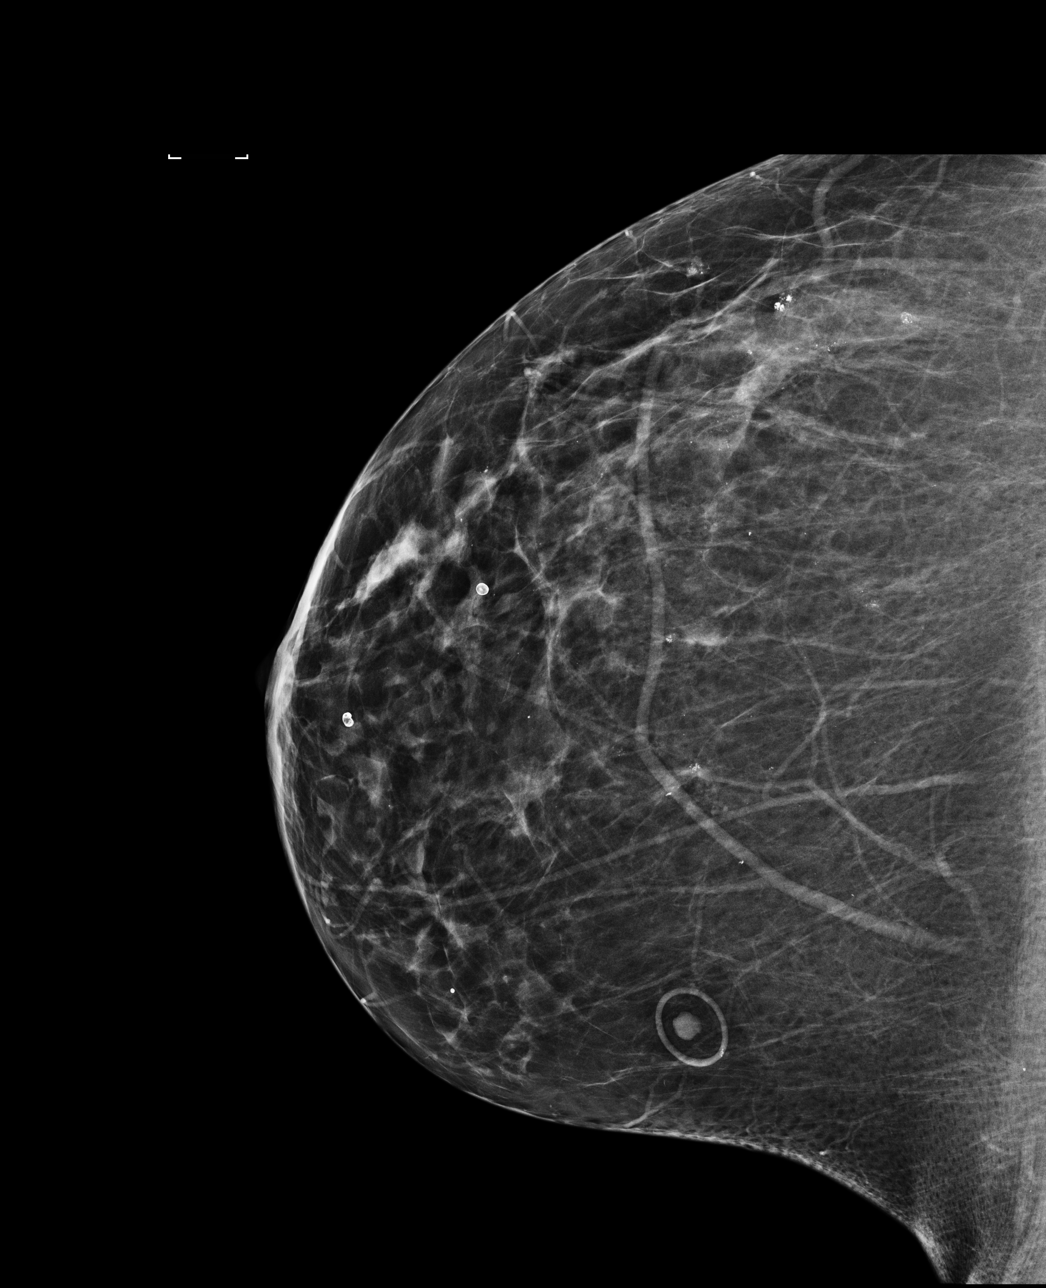

[4 of 4 positions shown; findings below may reference images not displayed]

ACR Breast Density Category b: There are scattered areas of
fibroglandular density.
FINDINGS: There are no findings suspicious for malignancy. Images were
processed with CAD.
IMPRESSION: No mammographic evidence of malignancy. A result letter of this
screening mammogram will be mailed directly to the patient.

RECOMMENDATION:
Screening mammogram in one year. (Code:AS-G-LCT)

BI-RADS CATEGORY  1: Negative.

## 2020-02-26 DIAGNOSIS — E785 Hyperlipidemia, unspecified: Secondary | ICD-10-CM | POA: Diagnosis not present

## 2020-02-26 DIAGNOSIS — E559 Vitamin D deficiency, unspecified: Secondary | ICD-10-CM | POA: Diagnosis not present

## 2020-02-26 DIAGNOSIS — E119 Type 2 diabetes mellitus without complications: Secondary | ICD-10-CM | POA: Diagnosis not present

## 2020-02-26 DIAGNOSIS — I10 Essential (primary) hypertension: Secondary | ICD-10-CM | POA: Diagnosis not present

## 2020-02-29 ENCOUNTER — Other Ambulatory Visit: Payer: Self-pay | Admitting: Physician Assistant

## 2020-02-29 DIAGNOSIS — Z1231 Encounter for screening mammogram for malignant neoplasm of breast: Secondary | ICD-10-CM

## 2020-03-10 ENCOUNTER — Telehealth: Payer: Self-pay | Admitting: *Deleted

## 2020-03-10 NOTE — Telephone Encounter (Signed)
Spoke with the patient and canceled her appt for 10/13. Explained that Dr Berline Lopes injured her leg and will be out of the office for a while. Explained that the office will call her back once appts can be scheduled

## 2020-03-16 ENCOUNTER — Inpatient Hospital Stay: Payer: BC Managed Care – PPO | Admitting: Gynecologic Oncology

## 2020-03-16 DIAGNOSIS — L570 Actinic keratosis: Secondary | ICD-10-CM | POA: Diagnosis not present

## 2020-03-16 DIAGNOSIS — L720 Epidermal cyst: Secondary | ICD-10-CM | POA: Diagnosis not present

## 2020-03-16 DIAGNOSIS — L821 Other seborrheic keratosis: Secondary | ICD-10-CM | POA: Diagnosis not present

## 2020-03-16 DIAGNOSIS — D225 Melanocytic nevi of trunk: Secondary | ICD-10-CM | POA: Diagnosis not present

## 2020-03-16 DIAGNOSIS — L814 Other melanin hyperpigmentation: Secondary | ICD-10-CM | POA: Diagnosis not present

## 2020-04-06 ENCOUNTER — Telehealth: Payer: Self-pay | Admitting: *Deleted

## 2020-04-06 NOTE — Telephone Encounter (Signed)
Called and rescheduled her appt with Dr Berline Lopes

## 2020-04-14 ENCOUNTER — Ambulatory Visit
Admission: RE | Admit: 2020-04-14 | Discharge: 2020-04-14 | Disposition: A | Discharge status: Home / Self Care | Payer: BC Managed Care – PPO | Source: Ambulatory Visit | Attending: Physician Assistant | Admitting: Physician Assistant

## 2020-04-14 ENCOUNTER — Other Ambulatory Visit: Payer: Self-pay

## 2020-04-14 DIAGNOSIS — Z1231 Encounter for screening mammogram for malignant neoplasm of breast: Secondary | ICD-10-CM

## 2020-04-15 NOTE — Progress Notes (Signed)
Gynecologic Oncology Return Clinic Visit  04/18/20  Reason for Visit: surveillance in the setting of early-stage endometrial cancer  Treatment History: Oncology History Overview Note  MMR IHC preserved MSI-stable   Endometrial cancer ([place])  08/19/2019 Initial Biopsy   EMB - gr1 EMCA   08/19/2019 Initial Diagnosis   Endometrial cancer ([place])   09/11/2019 Surgery   TRH/BSO, SLN   09/11/2019 Pathologic Stage   IA, grade 1 endometrioid, SLNs negative, no LVSI    Interval History: Reports doing well since her last visit.  Denies any changes to her medical, surgical, or family history.  Reports 1 or 2 very limited episodes of spotting after doing some heavy lifting.  She recently moved to [place] to be closer to her daughter.  She denies any other vaginal bleeding or discharge.  Denies pelvic or abdominal pain.  Reports regular bowel and bladder function.  Endorses having a good appetite without nausea or emesis.  Has been able to lose some weight secondary to increasing her activity (walking).  Past Medical/Surgical History: Past Medical History:  Diagnosis Date   Diabetes mellitus without complication ([place])    type 2   Endometrial cancer ([facility])    High cholesterol    Hypertension    Obesity (BMI 30.0-34.9)     Past Surgical History:  Procedure Laterality Date   COLONOSCOPY     ROBOTIC ASSISTED TOTAL HYSTERECTOMY WITH BILATERAL SALPINGO OOPHERECTOMY Bilateral 09/11/2019   Procedure: XI ROBOTIC ASSISTED TOTAL HYSTERECTOMY WITH BILATERAL SALPINGO OOPHORECTOMY;  Surgeon: Lafonda Mosses, MD;  Location: [facility];  Service: Gynecology;  Laterality: Bilateral;   SENTINEL NODE BIOPSY N/A 09/11/2019   Procedure: SENTINEL NODE BIOPSY;  Surgeon: Lafonda Mosses, MD;  Location: [facility];  Service: Gynecology;  Laterality: N/A;   TOTAL KNEE ARTHROPLASTY Right 11/11/2012   Procedure: RIGHT TOTAL KNEE ARTHROPLASTY;  Surgeon: Mauri Pole, MD;  Location: [facility];  Service: Orthopedics;   Laterality: Right;   TOTAL KNEE ARTHROPLASTY Left 12/09/2012   Procedure: LEFT TOTAL KNEE ARTHROPLASTY;  Surgeon: Mauri Pole, MD;  Location: [facility];  Service: Orthopedics;  Laterality: Left;   WISDOM TOOTH EXTRACTION      Family History  Problem Relation Age of Onset   Hypertension Mother    Hyperlipidemia Mother    Diabetes Father    Hypertension Father    Hypertension Paternal Grandmother    Diabetes Paternal Grandmother    Heart attack Paternal Grandmother    Hypertension Paternal Grandfather    Colon cancer Neg Hx    Uterine cancer Neg Hx    Breast cancer Neg Hx    Ovarian cancer Neg Hx     Social History   Socioeconomic History   Marital status: Divorced    Spouse name: Not on file   Number of children: Not on file   Years of education: Not on file   Highest education level: Not on file  Occupational History   Not on file  Tobacco Use   Smoking status: Never Smoker   Smokeless tobacco: Never Used  Vaping Use   Vaping Use: Never used  Substance and Sexual Activity   Alcohol use: No   Drug use: No   Sexual activity: Not Currently  Other Topics Concern   Not on file  Social History Narrative   Not on file   Social Determinants of Health   Financial Resource Strain:    Difficulty of Paying Living Expenses: Not on file  Food Insecurity:    Worried  About Running Out of Food in the Last Year: Not on file   Ran Out of Food in the Last Year: Not on file  Transportation Needs:    Lack of Transportation (Medical): Not on file   Lack of Transportation (Non-Medical): Not on file  Physical Activity:    Days of Exercise per Week: Not on file   Minutes of Exercise per Session: Not on file  Stress:    Feeling of Stress : Not on file  Social Connections:    Frequency of Communication with Friends and Family: Not on file   Frequency of Social Gatherings with Friends and Family: Not on file   Attends Religious Services: Not on  file   Active Member of Clubs or Organizations: Not on file   Attends Archivist Meetings: Not on file   Marital Status: Not on file    Current Medications:  Current Outpatient Medications:    Cholecalciferol (VITAMIN D3) 50 MCG (2000 UT) TABS, Take 2,000 Units by mouth daily., Disp: , Rfl:    clindamycin (CLEOCIN) 300 MG capsule, Take 900 mg by mouth See admin instructions. TAKE 3 CAPSULES (900 MG) BY MOUTH 1 HOUR PRIOR TO DENTAL APPOINTMENTS, Disp: , Rfl:    ezetimibe-simvastatin (VYTORIN) 10-40 MG per tablet, Take 1 tablet by mouth at bedtime., Disp: , Rfl:    ibuprofen (ADVIL) 600 MG tablet, Take 1 tablet (600 mg total) by mouth every 8 (eight) hours as needed for mild pain. For AFTER surgery, Disp: 30 tablet, Rfl: 0   JARDIANCE 25 MG TABS tablet, Take 25 mg by mouth daily., Disp: , Rfl:    lisinopril-hydrochlorothiazide (ZESTORETIC) 20-12.5 MG tablet, Take 1 tablet by mouth daily., Disp: , Rfl:    metFORMIN (GLUCOPHAGE) 1000 MG tablet, Take 1,000 mg by mouth 2 (two) times daily., Disp: , Rfl:    OZEMPIC, 0.25 OR 0.5 MG/DOSE, 2 MG/1.5ML SOPN, Inject 0.5 mg into the skin every Sunday. , Disp: , Rfl:    senna-docusate (SENOKOT-S) 8.6-50 MG tablet, Take 2 tablets by mouth at bedtime. For AFTER surgery, do not take if having diarrhea, Disp: 30 tablet, Rfl: 0   traMADol (ULTRAM) 50 MG tablet, Take 1 tablet (50 mg total) by mouth every 6 (six) hours as needed for severe pain. For AFTER surgery, do not take and drive, Disp: 10 tablet, Rfl: 0   nystatin (MYCOSTATIN/NYSTOP) powder, Apply 1 application topically 3 (three) times daily., Disp: 15 g, Rfl: 1  Review of Systems: Denies appetite changes, fevers, chills, fatigue, unexplained weight changes. Denies hearing loss, neck lumps or masses, mouth sores, ringing in ears or voice changes. Denies cough or wheezing.  Denies shortness of breath. Denies chest pain or palpitations. Denies leg swelling. Denies abdominal  distention, pain, blood in stools, constipation, diarrhea, nausea, vomiting, or early satiety. Denies pain with intercourse, dysuria, frequency, hematuria or incontinence. Denies hot flashes, pelvic pain, vaginal bleeding or vaginal discharge.   Denies joint pain, back pain or muscle pain/cramps. Denies itching, rash, or wounds. Denies dizziness, headaches, numbness or seizures. Denies swollen lymph nodes or glands, denies easy bruising or bleeding. Denies anxiety, depression, confusion, or decreased concentration.  Physical Exam: BP 115/62 (BP Location: Left Arm, Patient Position: Sitting)    Pulse 92    Temp (!) 97 F (36.1 C) (Tympanic)    Resp 18    Ht _0  (1.651 m)    Wt 195 lb (88.5 kg)    SpO2 99%    BMI 32.45 kg/m  General: Alert, oriented, no acute distress. HEENT: Normocephalic, atraumatic, sclera anicteric. Chest: Unlabored breathing on room air. Abdomen: Obese, soft, nontender.  Normoactive bowel sounds.  No masses or hepatosplenomegaly appreciated.  Well-healed laparoscopic incisions. Extremities: Grossly normal range of motion.  Warm, well perfused.  No edema bilaterally. Skin: No lesions noted.  Raised and erythematous rash under mid portion and right side of pannus consistent with a yeast infection. Lymphatics: No cervical, supraclavicular, or inguinal adenopathy. GU: Normal appearing external genitalia without erythema, excoriation, or lesions.  Speculum exam reveals vaginal mucosa intact, no lesions noted, cuff intact, no bleeding or discharge.  Bimanual exam reveals no nodularity or masses appreciated at the cuff.  Rectovaginal exam confirms these findings.  Laboratory & Radiologic Studies: None new  Assessment & Plan: Tracey Foster is a 64 y.o. woman with Stage IA grade 1 low risk endometrial adenocarcinoma who presents for postoperative follow-up.  Patient is NED on exam today.  Overall doing very well. Per SGO surveillance recommendations, I will see the patient  every 6 months for the first year and then yearly.  We discussed that while the risk of recurrence is low, it would most commonly occur at the vaginal cuff.  I reviewed signs and symptoms that should prompt a call prior to her next scheduled visit such as vaginal bleeding or pelvic pain.  Given her Candida infection underneath her pannus, we discussed nystatin treatment.  I sent a prescription to her pharmacy for powder to use for this.  32 minutes of total time was spent for this patient encounter, including preparation, face-to-face counseling with the patient and coordination of care, and documentation of the encounter.  Jeral Pinch, MD  Division of Gynecologic Oncology  Department of Obstetrics and Gynecology  [facility]

## 2020-04-18 ENCOUNTER — Encounter: Payer: Self-pay | Admitting: Gynecologic Oncology

## 2020-04-18 ENCOUNTER — Inpatient Hospital Stay: Payer: BC Managed Care – PPO | Attending: Gynecologic Oncology | Admitting: Gynecologic Oncology

## 2020-04-18 ENCOUNTER — Other Ambulatory Visit: Payer: Self-pay

## 2020-04-18 VITALS — BP 115/62 | HR 92 | Temp 97.0°F | Resp 18 | Ht 65.0 in | Wt 195.0 lb

## 2020-04-18 DIAGNOSIS — E119 Type 2 diabetes mellitus without complications: Secondary | ICD-10-CM | POA: Insufficient documentation

## 2020-04-18 DIAGNOSIS — E7800 Pure hypercholesterolemia, unspecified: Secondary | ICD-10-CM | POA: Diagnosis not present

## 2020-04-18 DIAGNOSIS — Z90710 Acquired absence of both cervix and uterus: Secondary | ICD-10-CM | POA: Insufficient documentation

## 2020-04-18 DIAGNOSIS — Z90722 Acquired absence of ovaries, bilateral: Secondary | ICD-10-CM | POA: Insufficient documentation

## 2020-04-18 DIAGNOSIS — C541 Malignant neoplasm of endometrium: Secondary | ICD-10-CM | POA: Diagnosis not present

## 2020-04-18 DIAGNOSIS — Z7984 Long term (current) use of oral hypoglycemic drugs: Secondary | ICD-10-CM | POA: Diagnosis not present

## 2020-04-18 DIAGNOSIS — Z6832 Body mass index (BMI) 32.0-32.9, adult: Secondary | ICD-10-CM | POA: Insufficient documentation

## 2020-04-18 DIAGNOSIS — E669 Obesity, unspecified: Secondary | ICD-10-CM | POA: Insufficient documentation

## 2020-04-18 DIAGNOSIS — I10 Essential (primary) hypertension: Secondary | ICD-10-CM | POA: Insufficient documentation

## 2020-04-18 DIAGNOSIS — B379 Candidiasis, unspecified: Secondary | ICD-10-CM | POA: Diagnosis not present

## 2020-04-18 DIAGNOSIS — Z79899 Other long term (current) drug therapy: Secondary | ICD-10-CM | POA: Insufficient documentation

## 2020-04-18 MED ORDER — NYSTATIN 100000 UNIT/GM EX POWD: 1.0000 "application " | Freq: Three times a day (TID) | CUTANEOUS | 1 refills | Status: DC

## 2020-04-18 NOTE — Patient Instructions (Signed)
Everything looks great on your exam today - no evidence of cancer! Please call back after the new year to get your 6 month follow-up scheduled in May 2022. If you develop any new symptoms before then, like vaginal bleeding or change to your bowel movements, please call us at [phone] to be seen sooner.

## 2020-05-06 DIAGNOSIS — E119 Type 2 diabetes mellitus without complications: Secondary | ICD-10-CM | POA: Diagnosis not present

## 2020-05-18 DIAGNOSIS — Z23 Encounter for immunization: Secondary | ICD-10-CM | POA: Diagnosis not present

## 2020-05-19 ENCOUNTER — Telehealth: Payer: Self-pay | Admitting: *Deleted

## 2020-05-19 NOTE — Telephone Encounter (Signed)
Called the patient and scheduled a follow up appt for May with Dr Berline Lopes

## 2020-06-16 DIAGNOSIS — D485 Neoplasm of uncertain behavior of skin: Secondary | ICD-10-CM | POA: Diagnosis not present

## 2020-06-16 DIAGNOSIS — L820 Inflamed seborrheic keratosis: Secondary | ICD-10-CM | POA: Diagnosis not present

## 2020-10-03 DIAGNOSIS — Z23 Encounter for immunization: Secondary | ICD-10-CM | POA: Diagnosis not present

## 2020-10-03 DIAGNOSIS — E785 Hyperlipidemia, unspecified: Secondary | ICD-10-CM | POA: Diagnosis not present

## 2020-10-03 DIAGNOSIS — E1169 Type 2 diabetes mellitus with other specified complication: Secondary | ICD-10-CM | POA: Diagnosis not present

## 2020-10-03 DIAGNOSIS — Z0000 Encounter for general adult medical examination without abnormal findings: Secondary | ICD-10-CM | POA: Diagnosis not present

## 2020-10-03 DIAGNOSIS — Z7984 Long term (current) use of oral hypoglycemic drugs: Secondary | ICD-10-CM | POA: Diagnosis not present

## 2020-10-03 DIAGNOSIS — I10 Essential (primary) hypertension: Secondary | ICD-10-CM | POA: Diagnosis not present

## 2020-10-11 DIAGNOSIS — Z7982 Long term (current) use of aspirin: Secondary | ICD-10-CM | POA: Diagnosis not present

## 2020-10-11 DIAGNOSIS — R040 Epistaxis: Secondary | ICD-10-CM | POA: Diagnosis not present

## 2020-10-11 DIAGNOSIS — J339 Nasal polyp, unspecified: Secondary | ICD-10-CM | POA: Diagnosis not present

## 2020-10-17 ENCOUNTER — Encounter: Payer: Self-pay | Admitting: Gynecologic Oncology

## 2020-10-17 ENCOUNTER — Other Ambulatory Visit: Payer: Self-pay

## 2020-10-17 ENCOUNTER — Inpatient Hospital Stay: Payer: BC Managed Care – PPO | Attending: Gynecologic Oncology | Admitting: Gynecologic Oncology

## 2020-10-17 VITALS — BP 108/59 | HR 78 | Temp 97.8°F | Resp 16 | Ht 65.0 in | Wt 191.2 lb

## 2020-10-17 DIAGNOSIS — E669 Obesity, unspecified: Secondary | ICD-10-CM | POA: Insufficient documentation

## 2020-10-17 DIAGNOSIS — Z7984 Long term (current) use of oral hypoglycemic drugs: Secondary | ICD-10-CM | POA: Diagnosis not present

## 2020-10-17 DIAGNOSIS — Z90722 Acquired absence of ovaries, bilateral: Secondary | ICD-10-CM | POA: Diagnosis not present

## 2020-10-17 DIAGNOSIS — E7800 Pure hypercholesterolemia, unspecified: Secondary | ICD-10-CM | POA: Diagnosis not present

## 2020-10-17 DIAGNOSIS — I10 Essential (primary) hypertension: Secondary | ICD-10-CM | POA: Insufficient documentation

## 2020-10-17 DIAGNOSIS — Z6831 Body mass index (BMI) 31.0-31.9, adult: Secondary | ICD-10-CM | POA: Insufficient documentation

## 2020-10-17 DIAGNOSIS — Z90710 Acquired absence of both cervix and uterus: Secondary | ICD-10-CM | POA: Diagnosis not present

## 2020-10-17 DIAGNOSIS — C541 Malignant neoplasm of endometrium: Secondary | ICD-10-CM | POA: Diagnosis not present

## 2020-10-17 DIAGNOSIS — Z7189 Other specified counseling: Secondary | ICD-10-CM | POA: Diagnosis not present

## 2020-10-17 DIAGNOSIS — Z79899 Other long term (current) drug therapy: Secondary | ICD-10-CM | POA: Diagnosis not present

## 2020-10-17 DIAGNOSIS — E119 Type 2 diabetes mellitus without complications: Secondary | ICD-10-CM | POA: Insufficient documentation

## 2020-10-17 NOTE — Progress Notes (Signed)
Gynecologic Oncology Return Clinic Visit  10/17/20  Reason for Visit: surveillance visit in the setting of early-stage endometrial cancer  Treatment History: Oncology History Overview Note  MMR IHC preserved MSI-stable   Endometrial cancer ([place])  08/19/2019 Initial Biopsy   EMB - gr1 EMCA   08/19/2019 Initial Diagnosis   Endometrial cancer ([place])   09/11/2019 Surgery   TRH/BSO, SLN   09/11/2019 Pathologic Stage   IA, grade 1 endometrioid, SLNs negative, no LVSI     Interval History: Patient presents for surveillance visit today.  She reports overall doing well.  She has no settled in [place].  She denies any vaginal bleeding or discharge.  She reports regular bowel and bladder function.  She endorses a good appetite without nausea or emesis.  She has continued to lose some weight due to her walking regimen that she started after surgery.  At her last visit she had some evidence of Candida under her pannus.  She has not had trouble with this for a number of months but several days ago's noticed erythema again under her pannus and yesterday some on her left side.  Last week, she had several nosebleeds that lasted multiple days.  She has not had any nosebleeds since but is seeing an ear nose and throat provider next week.  Past Medical/Surgical History: Past Medical History:  Diagnosis Date  . Diabetes mellitus without complication ([place])    type 2  . Endometrial cancer ([place])   . High cholesterol   . Hypertension   . Obesity (BMI 30.0-34.9)     Past Surgical History:  Procedure Laterality Date  . COLONOSCOPY    . ROBOTIC ASSISTED TOTAL HYSTERECTOMY WITH BILATERAL SALPINGO OOPHERECTOMY Bilateral 09/11/2019   Procedure: XI ROBOTIC ASSISTED TOTAL HYSTERECTOMY WITH BILATERAL SALPINGO OOPHORECTOMY;  Surgeon: Lafonda Mosses, MD;  Location: [facility];  Service: Gynecology;  Laterality: Bilateral;  . SENTINEL NODE BIOPSY N/A 09/11/2019   Procedure: SENTINEL NODE BIOPSY;  Surgeon: Lafonda Mosses, MD;  Location: [facility];  Service: Gynecology;  Laterality: N/A;  . TOTAL KNEE ARTHROPLASTY Right 11/11/2012   Procedure: RIGHT TOTAL KNEE ARTHROPLASTY;  Surgeon: Mauri Pole, MD;  Location: [facility];  Service: Orthopedics;  Laterality: Right;  . TOTAL KNEE ARTHROPLASTY Left 12/09/2012   Procedure: LEFT TOTAL KNEE ARTHROPLASTY;  Surgeon: Mauri Pole, MD;  Location: [facility];  Service: Orthopedics;  Laterality: Left;  . WISDOM TOOTH EXTRACTION      Family History  Problem Relation Age of Onset  . Hypertension Mother   . Hyperlipidemia Mother   . Diabetes Father   . Hypertension Father   . Hypertension Paternal Grandmother   . Diabetes Paternal Grandmother   . Heart attack Paternal Grandmother   . Hypertension Paternal Grandfather   . Colon cancer Neg Hx   . Uterine cancer Neg Hx   . Breast cancer Neg Hx   . Ovarian cancer Neg Hx     Social History   Socioeconomic History  . Marital status: Divorced    Spouse name: Not on file  . Number of children: Not on file  . Years of education: Not on file  . Highest education level: Not on file  Occupational History  . Not on file  Tobacco Use  . Smoking status: Never Smoker  . Smokeless tobacco: Never Used  Vaping Use  . Vaping Use: Never used  Substance and Sexual Activity  . Alcohol use: No  . Drug use: No  . Sexual activity: Not  Currently  Other Topics Concern  . Not on file  Social History Narrative  . Not on file   Social Determinants of Health   Financial Resource Strain: Not on file  Food Insecurity: Not on file  Transportation Needs: Not on file  Physical Activity: Not on file  Stress: Not on file  Social Connections: Not on file    Current Medications:  Current Outpatient Medications:  .  Cholecalciferol (VITAMIN D3) 50 MCG (2000 UT) TABS, Take 2,000 Units by mouth daily., Disp: , Rfl:  .  clindamycin (CLEOCIN) 300 MG capsule, Take 900 mg by mouth See admin instructions. TAKE 3 CAPSULES (900 MG)  BY MOUTH 1 HOUR PRIOR TO DENTAL APPOINTMENTS, Disp: , Rfl:  .  ezetimibe-simvastatin (VYTORIN) 10-40 MG per tablet, Take 1 tablet by mouth at bedtime., Disp: , Rfl:  .  ibuprofen (ADVIL) 600 MG tablet, Take 1 tablet (600 mg total) by mouth every 8 (eight) hours as needed for mild pain. For AFTER surgery, Disp: 30 tablet, Rfl: 0 .  JARDIANCE 25 MG TABS tablet, Take 25 mg by mouth daily., Disp: , Rfl:  .  lisinopril-hydrochlorothiazide (ZESTORETIC) 20-12.5 MG tablet, Take 1 tablet by mouth daily., Disp: , Rfl:  .  metFORMIN (GLUCOPHAGE) 1000 MG tablet, Take 1,000 mg by mouth 2 (two) times daily., Disp: , Rfl:  .  nystatin (MYCOSTATIN/NYSTOP) powder, Apply 1 application topically 3 (three) times daily., Disp: 15 g, Rfl: 1 .  OZEMPIC, 0.25 OR 0.5 MG/DOSE, 2 MG/1.5ML SOPN, Inject 0.5 mg into the skin every Sunday. , Disp: , Rfl:  .  senna-docusate (SENOKOT-S) 8.6-50 MG tablet, Take 2 tablets by mouth at bedtime. For AFTER surgery, do not take if having diarrhea, Disp: 30 tablet, Rfl: 0 .  traMADol (ULTRAM) 50 MG tablet, Take 1 tablet (50 mg total) by mouth every 6 (six) hours as needed for severe pain. For AFTER surgery, do not take and drive, Disp: 10 tablet, Rfl: 0  Review of Systems: Pertinent positives as per HPI. Denies appetite changes, fevers, chills, fatigue, unexplained weight changes. Denies hearing loss, neck lumps or masses, mouth sores, ringing in ears or voice changes. Denies cough or wheezing.  Denies shortness of breath. Denies chest pain or palpitations. Denies leg swelling. Denies abdominal distention, pain, blood in stools, constipation, diarrhea, nausea, vomiting, or early satiety. Denies pain with intercourse, dysuria, frequency, hematuria or incontinence. Denies hot flashes, pelvic pain, vaginal bleeding or vaginal discharge.   Denies joint pain, back pain or muscle pain/cramps. Denies itching or wounds. Denies dizziness, headaches, numbness or seizures. Denies swollen lymph  nodes or glands, denies easy bruising or bleeding. Denies anxiety, depression, confusion, or decreased concentration.  Physical Exam: BP (!) 108/59 (BP Location: Left Arm, Patient Position: Sitting)   Pulse 78   Temp 97.8 F (36.6 C) (Tympanic)   Resp 16   Ht _0  (1.651 m)   Wt 191 lb 3.2 oz (86.7 kg)   SpO2 98%   BMI 31.82 kg/m  General: Alert, oriented, no acute distress. HEENT: Normocephalic, atraumatic, sclera anicteric. Chest: Unlabored breathing on room air. Abdomen: Obese, soft, nontender.  Normoactive bowel sounds.  No masses or hepatosplenomegaly appreciated.  Well-healed incisions. Extremities: Grossly normal range of motion.  Warm, well perfused.  No edema bilaterally. Skin: Some erythema along the midline underneath the patient's pannus as well as at the left aspect of her lower abdomen, consistent with Candida infection.   Lymphatics: No cervical, supraclavicular, or inguinal adenopathy. GU: Normal appearing external genitalia  without erythema, excoriation, or lesions.  Speculum exam reveals cuff intact, no lesions or masses noted.  No bleeding or discharge.  Bimanual exam reveals cuff intact, no nodularity or masses.  Rectovaginal exam confirms findings.  Laboratory & Radiologic Studies: None new  Assessment & Plan: ALOIS MINCER is a 65 y.o. 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  Patient is NED on exam today.  She was congratulated on her continued weight loss.  We reviewed both NCCN and SGO surveillance recommendations.  Now that she is a year out, we discussed that we could transition to yearly surveillance visits.  She would like to plan on 1 more year of visits every 6 months and then we will reassess possible transition to yearly visits.  I reviewed signs and symptoms that should prompt a call prior to her next scheduled visit such as vaginal bleeding or pelvic pain.  I prescribed nystatin at her  last visit, which worked well to treat her Candida infection.  She has gone a significant amount of time without issue.  She will restart using the powder given current symptoms and exam findings.  28 minutes of total time was spent for this patient encounter, including preparation, face-to-face counseling with the patient and coordination of care, and documentation of the encounter.  Jeral Pinch, MD  Division of Gynecologic Oncology  Department of Obstetrics and Gynecology  [facility]

## 2020-10-17 NOTE — Patient Instructions (Signed)
It was great to see you today! No evidence of cancer on your exam.  I will see you in 6 months unless you develop new symptoms before then. If so, please call the clinic to get a visit scheduled with me sooner.

## 2020-10-20 ENCOUNTER — Other Ambulatory Visit: Payer: Self-pay | Admitting: Gynecologic Oncology

## 2020-10-20 DIAGNOSIS — B379 Candidiasis, unspecified: Secondary | ICD-10-CM

## 2020-10-25 DIAGNOSIS — Z87898 Personal history of other specified conditions: Secondary | ICD-10-CM | POA: Diagnosis not present

## 2020-10-25 DIAGNOSIS — H6123 Impacted cerumen, bilateral: Secondary | ICD-10-CM | POA: Diagnosis not present

## 2020-10-25 DIAGNOSIS — J302 Other seasonal allergic rhinitis: Secondary | ICD-10-CM | POA: Diagnosis not present

## 2020-10-25 DIAGNOSIS — H61303 Acquired stenosis of external ear canal, unspecified, bilateral: Secondary | ICD-10-CM | POA: Diagnosis not present

## 2020-12-22 DIAGNOSIS — Z23 Encounter for immunization: Secondary | ICD-10-CM | POA: Diagnosis not present

## 2021-01-18 DIAGNOSIS — Z86010 Personal history of colonic polyps: Secondary | ICD-10-CM | POA: Diagnosis not present

## 2021-03-06 ENCOUNTER — Other Ambulatory Visit: Payer: Self-pay | Admitting: Physician Assistant

## 2021-03-06 DIAGNOSIS — Z1231 Encounter for screening mammogram for malignant neoplasm of breast: Secondary | ICD-10-CM

## 2021-03-15 ENCOUNTER — Telehealth: Payer: Self-pay | Admitting: *Deleted

## 2021-03-15 NOTE — Telephone Encounter (Signed)
Rescheduled appt from 11/15 to 11/14

## 2021-04-12 ENCOUNTER — Encounter: Payer: Self-pay | Admitting: Gynecologic Oncology

## 2021-04-17 ENCOUNTER — Other Ambulatory Visit: Payer: Self-pay

## 2021-04-17 ENCOUNTER — Inpatient Hospital Stay: Payer: Medicare Other | Attending: Gynecologic Oncology | Admitting: Gynecologic Oncology

## 2021-04-17 ENCOUNTER — Encounter: Payer: Self-pay | Admitting: Gynecologic Oncology

## 2021-04-17 ENCOUNTER — Ambulatory Visit
Admission: RE | Admit: 2021-04-17 | Discharge: 2021-04-17 | Disposition: A | Discharge status: Home / Self Care | Payer: Medicare Other | Source: Ambulatory Visit | Attending: Physician Assistant | Admitting: Physician Assistant

## 2021-04-17 VITALS — BP 121/69 | HR 94 | Temp 97.7°F | Resp 16 | Ht 65.0 in | Wt 192.0 lb

## 2021-04-17 DIAGNOSIS — Z90722 Acquired absence of ovaries, bilateral: Secondary | ICD-10-CM | POA: Insufficient documentation

## 2021-04-17 DIAGNOSIS — Z7984 Long term (current) use of oral hypoglycemic drugs: Secondary | ICD-10-CM | POA: Insufficient documentation

## 2021-04-17 DIAGNOSIS — Z79899 Other long term (current) drug therapy: Secondary | ICD-10-CM | POA: Diagnosis not present

## 2021-04-17 DIAGNOSIS — E7800 Pure hypercholesterolemia, unspecified: Secondary | ICD-10-CM | POA: Insufficient documentation

## 2021-04-17 DIAGNOSIS — E119 Type 2 diabetes mellitus without complications: Secondary | ICD-10-CM | POA: Diagnosis not present

## 2021-04-17 DIAGNOSIS — I10 Essential (primary) hypertension: Secondary | ICD-10-CM | POA: Insufficient documentation

## 2021-04-17 DIAGNOSIS — E669 Obesity, unspecified: Secondary | ICD-10-CM | POA: Diagnosis not present

## 2021-04-17 DIAGNOSIS — C541 Malignant neoplasm of endometrium: Secondary | ICD-10-CM

## 2021-04-17 DIAGNOSIS — Z90710 Acquired absence of both cervix and uterus: Secondary | ICD-10-CM | POA: Diagnosis not present

## 2021-04-17 DIAGNOSIS — Z791 Long term (current) use of non-steroidal anti-inflammatories (NSAID): Secondary | ICD-10-CM | POA: Diagnosis not present

## 2021-04-17 DIAGNOSIS — Z6831 Body mass index (BMI) 31.0-31.9, adult: Secondary | ICD-10-CM | POA: Insufficient documentation

## 2021-04-17 DIAGNOSIS — Z7982 Long term (current) use of aspirin: Secondary | ICD-10-CM | POA: Insufficient documentation

## 2021-04-17 DIAGNOSIS — Z1231 Encounter for screening mammogram for malignant neoplasm of breast: Secondary | ICD-10-CM

## 2021-04-17 NOTE — Patient Instructions (Signed)
It was good to see you today. There is no evidence of cancer on your exam.   I will see you in 6 months for a follow-up visit unless you develop new symptoms before then. If so, please call the clinic to get a visit scheduled with me sooner.

## 2021-04-17 NOTE — Progress Notes (Signed)
Gynecologic Oncology Return Clinic Visit  04/17/21  Reason for Visit: surveillance visit in the setting of early-stage endometrial cancer  Treatment History: Oncology History Overview Note  MMR IHC preserved MSI-stable   Endometrial cancer ([place])  08/19/2019 Initial Biopsy   EMB - gr1 EMCA   08/19/2019 Initial Diagnosis   Endometrial cancer ([place])   09/11/2019 Surgery   TRH/BSO, SLN   09/11/2019 Pathologic Stage   IA, grade 1 endometrioid, SLNs negative, no LVSI     Interval History: Patient comes in for surveillance visit today.  She is overall doing well.  She denies any vaginal bleeding or discharge.  She endorses normal bowel and bladder function.  She endorses a good appetite without nausea or emesis.  Denies any abdominal pain or pelvic pain.  Travel to the beach with her family this summer.  She is looking forward to hosting family Thanksgiving at her house.  Past Medical/Surgical History: Past Medical History:  Diagnosis Date   Diabetes mellitus without complication ([place])    type 2   Endometrial cancer ([facility])    High cholesterol    Hypertension    Obesity (BMI 30.0-34.9)     Past Surgical History:  Procedure Laterality Date   COLONOSCOPY     ROBOTIC ASSISTED TOTAL HYSTERECTOMY WITH BILATERAL SALPINGO OOPHERECTOMY Bilateral 09/11/2019   Procedure: XI ROBOTIC ASSISTED TOTAL HYSTERECTOMY WITH BILATERAL SALPINGO OOPHORECTOMY;  Surgeon: Lafonda Mosses, MD;  [facility];  Service: Gynecology;  Laterality: Bilateral;   SENTINEL NODE BIOPSY N/A 09/11/2019   Procedure: SENTINEL NODE BIOPSY;  Surgeon: Lafonda Mosses, MD;  [facility]: [facility];  Service: Gynecology;  Laterality: N/A;   TOTAL KNEE ARTHROPLASTY Right 11/11/2012   Procedure: RIGHT TOTAL KNEE ARTHROPLASTY;  Surgeon: Mauri Pole, MD;  Location: [facility];  Service: Orthopedics;  Laterality: Right;   TOTAL KNEE ARTHROPLASTY Left 12/09/2012   Procedure: LEFT TOTAL KNEE ARTHROPLASTY;  Surgeon: Mauri Pole, MD;   Location: [facility];  Service: Orthopedics;  Laterality: Left;   WISDOM TOOTH EXTRACTION      Family History  Problem Relation Age of Onset   Hypertension Mother    Hyperlipidemia Mother    Diabetes Father    Hypertension Father    Hypertension Paternal Grandmother    Diabetes Paternal Grandmother    Heart attack Paternal Grandmother    Hypertension Paternal Grandfather    Colon cancer Neg Hx    Uterine cancer Neg Hx    Breast cancer Neg Hx    Ovarian cancer Neg Hx     Social History   Socioeconomic History   Marital status: Divorced    Spouse name: Not on file   Number of children: Not on file   Years of education: Not on file   Highest education level: Not on file  Occupational History   Not on file  Tobacco Use   Smoking status: Never   Smokeless tobacco: Never  Vaping Use   Vaping Use: Never used  Substance and Sexual Activity   Alcohol use: No   Drug use: No   Sexual activity: Not Currently  Other Topics Concern   Not on file  Social History Narrative   Not on file   Social Determinants of Health   Financial Resource Strain: Not on file  Food Insecurity: Not on file  Transportation Needs: Not on file  Physical Activity: Not on file  Stress: Not on file  Social Connections: Not on file    Current Medications:  Current Outpatient  Medications:    aspirin EC 81 MG tablet, Take 81 mg by mouth daily. Swallow whole., Disp: , Rfl:    Cholecalciferol (VITAMIN D3) 50 MCG (2000 UT) TABS, Take 2,000 Units by mouth daily., Disp: , Rfl:    clindamycin (CLEOCIN) 300 MG capsule, Take 900 mg by mouth See admin instructions. TAKE 3 CAPSULES (900 MG) BY MOUTH 1 HOUR PRIOR TO DENTAL APPOINTMENTS, Disp: , Rfl:    ezetimibe-simvastatin (VYTORIN) 10-40 MG per tablet, Take 1 tablet by mouth at bedtime., Disp: , Rfl:    ibuprofen (ADVIL) 600 MG tablet, Take 1 tablet (600 mg total) by mouth every 8 (eight) hours as needed for mild pain. For AFTER surgery, Disp: 30 tablet, Rfl:  0   JARDIANCE 25 MG TABS tablet, Take 25 mg by mouth daily., Disp: , Rfl:    lisinopril-hydrochlorothiazide (ZESTORETIC) 20-12.5 MG tablet, Take 1 tablet by mouth daily., Disp: , Rfl:    metFORMIN (GLUCOPHAGE) 1000 MG tablet, Take 1,000 mg by mouth 2 (two) times daily., Disp: , Rfl:    Multiple Vitamins-Minerals (VITEYES AREDS FORMULA PO), Take by mouth., Disp: , Rfl:    nystatin (MYCOSTATIN/NYSTOP) powder, APPLY 1 APPLICATION TOPICALLY 3 (THREE) TIMES DAILY., Disp: 15 g, Rfl: 1   OZEMPIC, 0.25 OR 0.5 MG/DOSE, 2 MG/1.5ML SOPN, Inject 0.5 mg into the skin every Sunday. , Disp: , Rfl:    senna-docusate (SENOKOT-S) 8.6-50 MG tablet, Take 2 tablets by mouth at bedtime. For AFTER surgery, do not take if having diarrhea, Disp: 30 tablet, Rfl: 0   traMADol (ULTRAM) 50 MG tablet, Take 1 tablet (50 mg total) by mouth every 6 (six) hours as needed for severe pain. For AFTER surgery, do not take and drive, Disp: 10 tablet, Rfl: 0  Review of Systems: Denies appetite changes, fevers, chills, fatigue, unexplained weight changes. Denies hearing loss, neck lumps or masses, mouth sores, ringing in ears or voice changes. Denies cough or wheezing.  Denies shortness of breath. Denies chest pain or palpitations. Denies leg swelling. Denies abdominal distention, pain, blood in stools, constipation, diarrhea, nausea, vomiting, or early satiety. Denies pain with intercourse, dysuria, frequency, hematuria or incontinence. Denies hot flashes, pelvic pain, vaginal bleeding or vaginal discharge.   Denies joint pain, back pain or muscle pain/cramps. Denies itching, rash, or wounds. Denies dizziness, headaches, numbness or seizures. Denies swollen lymph nodes or glands, denies easy bruising or bleeding. Denies anxiety, depression, confusion, or decreased concentration.  Physical Exam: BP 121/69 (BP Location: Left Arm, Patient Position: Sitting)   Pulse 94   Temp 97.7 F (36.5 C) (Tympanic)   Resp 16   Ht 5' 5"  (1.651 m)   Wt 192 lb (87.1 kg)   SpO2 98%   BMI 31.95 kg/m  General: Alert, oriented, no acute distress. HEENT: Normocephalic, atraumatic, sclera anicteric. Chest: Clear to auscultation bilaterally.  No rhonchi or wheezes. Cardiovascular: Regular rate and rhythm, no murmurs. Abdomen: Obese, soft, nontender.  Normoactive bowel sounds.  No masses or hepatosplenomegaly appreciated.  Well-healed incisions. Extremities: Grossly normal range of motion.  Warm, well perfused.  No edema bilaterally. Skin: No rashes or lesions noted. Lymphatics: No cervical, supraclavicular, or inguinal adenopathy. GU: Normal appearing external genitalia without erythema, excoriation, or lesions.  Speculum exam reveals mildly atrophic vaginal mucosa, no lesions or masses, no discharge or bleeding.  Bimanual exam reveals cuff intact, no masses or nodularity.  Rectovaginal exam confirms these findings.  Laboratory & Radiologic Studies: None new  Assessment & Plan: Tracey Foster is a  65 y.o. woman with Stage IA grade 1 low risk endometrial adenocarcinoma who presents for follow-up.    Patient is doing well and is NED on exam today.  Per NCCN surveillance recommendations, we will continue with visits every 6 months, although we had previously discussed possible transition to yearly visits.  I reviewed signs and symptoms again that should prompt a phone call prior to her next scheduled visit.  32 minutes of total time was spent for this patient encounter, including preparation, face-to-face counseling with the patient and coordination of care, and documentation of the encounter.  Jeral Pinch, MD  Division of Gynecologic Oncology  Department of Obstetrics and Gynecology  [facility]

## 2021-04-18 ENCOUNTER — Ambulatory Visit: Payer: BC Managed Care – PPO

## 2021-04-18 ENCOUNTER — Ambulatory Visit: Payer: BC Managed Care – PPO | Admitting: Gynecologic Oncology

## 2021-10-16 ENCOUNTER — Encounter: Payer: Self-pay | Admitting: Gynecologic Oncology

## 2021-10-23 ENCOUNTER — Other Ambulatory Visit: Payer: Self-pay

## 2021-10-23 ENCOUNTER — Encounter: Payer: Self-pay | Admitting: Gynecologic Oncology

## 2021-10-23 ENCOUNTER — Inpatient Hospital Stay: Payer: Medicare Other | Attending: Gynecologic Oncology | Admitting: Gynecologic Oncology

## 2021-10-23 VITALS — BP 112/54 | HR 88 | Temp 98.4°F | Resp 18 | Ht 65.0 in | Wt 186.2 lb

## 2021-10-23 DIAGNOSIS — Z90710 Acquired absence of both cervix and uterus: Secondary | ICD-10-CM | POA: Insufficient documentation

## 2021-10-23 DIAGNOSIS — C541 Malignant neoplasm of endometrium: Secondary | ICD-10-CM

## 2021-10-23 DIAGNOSIS — Z8542 Personal history of malignant neoplasm of other parts of uterus: Secondary | ICD-10-CM | POA: Insufficient documentation

## 2021-10-23 DIAGNOSIS — Z90722 Acquired absence of ovaries, bilateral: Secondary | ICD-10-CM | POA: Insufficient documentation

## 2021-10-23 NOTE — Progress Notes (Signed)
Gynecologic Oncology Return Clinic Visit  10/23/21  Reason for Visit: surveillance visit in the setting of early-stage endometrial cancer  Treatment History: Oncology History Overview Note  MMR IHC preserved MSI-stable   Endometrial cancer ([place])  08/19/2019 Initial Biopsy   EMB - gr1 EMCA   08/19/2019 Initial Diagnosis   Endometrial cancer ([place])   09/11/2019 Surgery   TRH/BSO, SLN   09/11/2019 Pathologic Stage   IA, grade 1 endometrioid, SLNs negative, no LVSI     Interval History: Patient reports doing well.  Denies any vaginal bleeding or discharge.  Denies any abdominal or pelvic pain.  Reports regular bowel and bladder function.  Just recently had her 53-monthPCP checkup.  Mammogram last at the end of 2022, colonoscopy last summer.  Past Medical/Surgical History: Past Medical History:  Diagnosis Date   Diabetes mellitus without complication ([place]    type 2   Endometrial cancer ([facility])    High cholesterol    Hypertension    Obesity (BMI 30.0-34.9)     Past Surgical History:  Procedure Laterality Date   COLONOSCOPY     ROBOTIC ASSISTED TOTAL HYSTERECTOMY WITH BILATERAL SALPINGO OOPHERECTOMY Bilateral 09/11/2019   Procedure: XI ROBOTIC ASSISTED TOTAL HYSTERECTOMY WITH BILATERAL SALPINGO OOPHORECTOMY;  Surgeon: TLafonda Mosses MD;  Location: [facility];  Service: Gynecology;  Laterality: Bilateral;   SENTINEL NODE BIOPSY N/A 09/11/2019   Procedure: SENTINEL NODE BIOPSY;  Surgeon: TLafonda Mosses MD;  Location: [facility];  Service: Gynecology;  Laterality: N/A;   TOTAL KNEE ARTHROPLASTY Right 11/11/2012   Procedure: RIGHT TOTAL KNEE ARTHROPLASTY;  Surgeon: MMauri Pole MD;  [facility]: [facility];  Service: Orthopedics;  Laterality: Right;   TOTAL KNEE ARTHROPLASTY Left 12/09/2012   Procedure: LEFT TOTAL KNEE ARTHROPLASTY;  Surgeon: MMauri Pole MD;  [facility]: [facility];  Service: Orthopedics;  Laterality: Left;   WISDOM TOOTH EXTRACTION      Family History  Problem Relation  Age of Onset   Hypertension Mother    Hyperlipidemia Mother    Diabetes Father    Hypertension Father    Hypertension Paternal Grandmother    Diabetes Paternal Grandmother    Heart attack Paternal Grandmother    Hypertension Paternal Grandfather    Colon cancer Neg Hx    Uterine cancer Neg Hx    Breast cancer Neg Hx    Ovarian cancer Neg Hx     Social History   Socioeconomic History   Marital status: Divorced    Spouse name: Not on file   Number of children: Not on file   Years of education: Not on file   Highest education level: Not on file  Occupational History   Not on file  Tobacco Use   Smoking status: Never   Smokeless tobacco: Never  Vaping Use   Vaping Use: Never used  Substance and Sexual Activity   Alcohol use: No   Drug use: No   Sexual activity: Not Currently  Other Topics Concern   Not on file  Social History Narrative   Not on file   Social Determinants of Health   Financial Resource Strain: Not on file  Food Insecurity: Not on file  Transportation Needs: Not on file  Physical Activity: Not on file  Stress: Not on file  Social Connections: Not on file    Current Medications:  Current Outpatient Medications:    aspirin EC 81 MG tablet, Take 81 mg by mouth daily. Swallow whole., Disp: , Rfl:    Cholecalciferol (VITAMIN  D3) 50 MCG (2000 UT) TABS, Take 2,000 Units by mouth daily., Disp: , Rfl:    clindamycin (CLEOCIN) 300 MG capsule, Take 900 mg by mouth See admin instructions. TAKE 3 CAPSULES (900 MG) BY MOUTH 1 HOUR PRIOR TO DENTAL APPOINTMENTS, Disp: , Rfl:    ezetimibe-simvastatin (VYTORIN) 10-40 MG per tablet, Take 1 tablet by mouth at bedtime., Disp: , Rfl:    JARDIANCE 25 MG TABS tablet, Take 25 mg by mouth daily., Disp: , Rfl:    lisinopril-hydrochlorothiazide (ZESTORETIC) 20-12.5 MG tablet, Take 1 tablet by mouth daily., Disp: , Rfl:    metFORMIN (GLUCOPHAGE) 1000 MG tablet, Take 1,000 mg by mouth 2 (two) times daily., Disp: , Rfl:     Multiple Vitamins-Minerals (VITEYES AREDS FORMULA PO), Take by mouth., Disp: , Rfl:    nystatin (MYCOSTATIN/NYSTOP) powder, APPLY 1 APPLICATION TOPICALLY 3 (THREE) TIMES DAILY., Disp: 15 g, Rfl: 1   OZEMPIC, 0.25 OR 0.5 MG/DOSE, 2 MG/1.5ML SOPN, Inject 0.5 mg into the skin every Sunday. , Disp: , Rfl:    ibuprofen (ADVIL) 600 MG tablet, Take 1 tablet (600 mg total) by mouth every 8 (eight) hours as needed for mild pain. For AFTER surgery (Patient not taking: Reported on 10/16/2021), Disp: 30 tablet, Rfl: 0   senna-docusate (SENOKOT-S) 8.6-50 MG tablet, Take 2 tablets by mouth at bedtime. For AFTER surgery, do not take if having diarrhea (Patient not taking: Reported on 10/16/2021), Disp: 30 tablet, Rfl: 0   traMADol (ULTRAM) 50 MG tablet, Take 1 tablet (50 mg total) by mouth every 6 (six) hours as needed for severe pain. For AFTER surgery, do not take and drive (Patient not taking: Reported on 10/16/2021), Disp: 10 tablet, Rfl: 0  Review of Systems: Denies appetite changes, fevers, chills, fatigue, unexplained weight changes. Denies hearing loss, neck lumps or masses, mouth sores, ringing in ears or voice changes. Denies cough or wheezing.  Denies shortness of breath. Denies chest pain or palpitations. Denies leg swelling. Denies abdominal distention, pain, blood in stools, constipation, diarrhea, nausea, vomiting, or early satiety. Denies pain with intercourse, dysuria, frequency, hematuria or incontinence. Denies hot flashes, pelvic pain, vaginal bleeding or vaginal discharge.   Denies joint pain, back pain or muscle pain/cramps. Denies itching, rash, or wounds. Denies dizziness, headaches, numbness or seizures. Denies swollen lymph nodes or glands, denies easy bruising or bleeding. Denies anxiety, depression, confusion, or decreased concentration.  Physical Exam: BP (!) 112/54 (BP Location: Right Arm, Patient Position: Sitting)   Pulse 88   Temp 98.4 F (36.9 C) (Oral)   Resp 18   Ht 5' 5"  (1.651 m)   Wt 186 lb 3.2 oz (84.5 kg)   SpO2 97%   BMI 30.99 kg/m  General: Alert, oriented, no acute distress. HEENT: Normocephalic, atraumatic, sclera anicteric. Chest: Clear to auscultation bilaterally.  No rhonchi or wheezes. Cardiovascular: Regular rate and rhythm, no murmurs. Abdomen: Obese, soft, nontender.  Normoactive bowel sounds.  No masses or hepatosplenomegaly appreciated.  Well-healed incisions. Extremities: Grossly normal range of motion.  Warm, well perfused.  No edema bilaterally. Skin: No rashes or lesions noted. Lymphatics: No cervical, supraclavicular, or inguinal adenopathy. GU: Normal appearing external genitalia without erythema, excoriation, or lesions.  Speculum exam reveals mildly atrophic vaginal mucosa, no lesions or masses, no discharge or bleeding.  Bimanual exam reveals cuff intact, no masses or nodularity.  Rectovaginal exam confirms these findings.  Laboratory & Radiologic Studies: None new  Assessment & Plan: Tracey Foster is a 66 y.o. 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  Definitive survey 09/2019. MMR intact, MSS.    Patient is doing well and is NED on exam today.    Per NCCN surveillance recommendations, we will continue with visits every 6 months.  She is now more than 2 years out from definitive surgery, I recommend that we transition to visits alternating between my office and her OB/GYN.  She will continue with visits every 6 months.    I will see her next in a year.  I have asked her to call after the new year to get a visit scheduled with me in May 2024.  I reviewed signs and symptoms again that should prompt a phone call prior to her next scheduled visit.  Routine pap tests are not indicated.  26 minutes of total time was spent for this patient encounter, including preparation, face-to-face counseling with the patient and coordination of care, and documentation of the  encounter.  Jeral Pinch, MD  Division of Gynecologic Oncology  Department of Obstetrics and Gynecology  [facility]

## 2021-10-23 NOTE — Patient Instructions (Addendum)
It was good to see you today.  I do not see or feel any evidence of cancer recurrence on your exam.  We are going to transition to visits alternating every 6 months between my office and your OB/GYN.  Please reach out to your OB/GYN (Dr. Landry Mellow) for a visit towards the end of the year.  Please call sometime after January to get a visit scheduled with me in May.  As always, if you develop any new and concerning symptoms for cancer recurrence, please call to see me sooner.

## 2021-12-19 IMAGING — MG MM DIGITAL SCREENING BILAT W/ TOMO AND CAD
8 series · 8 of 24 positions shown · non-contrast
Comparison: Previous exam(s).

CLINICAL DATA: Screening.

EXAM:
DIGITAL SCREENING BILATERAL MAMMOGRAM WITH TOMOSYNTHESIS AND CAD
TECHNIQUE: Bilateral screening digital craniocaudal and mediolateral oblique
mammograms were obtained. Bilateral screening digital breast
tomosynthesis was performed. The images were evaluated with
computer-aided detection.

[R CC synth-2D]
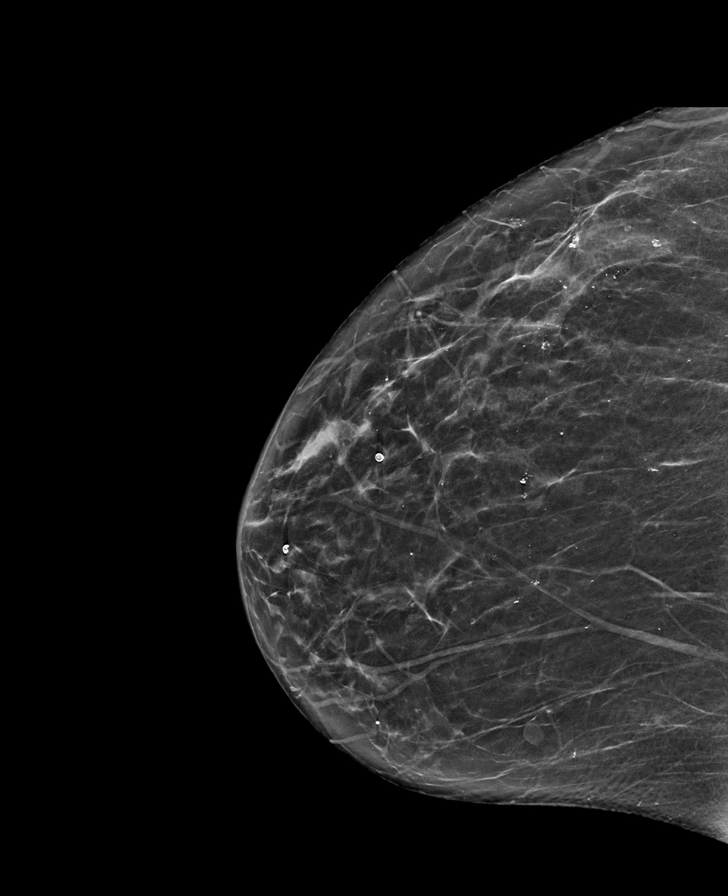

[L MLO synth-2D]
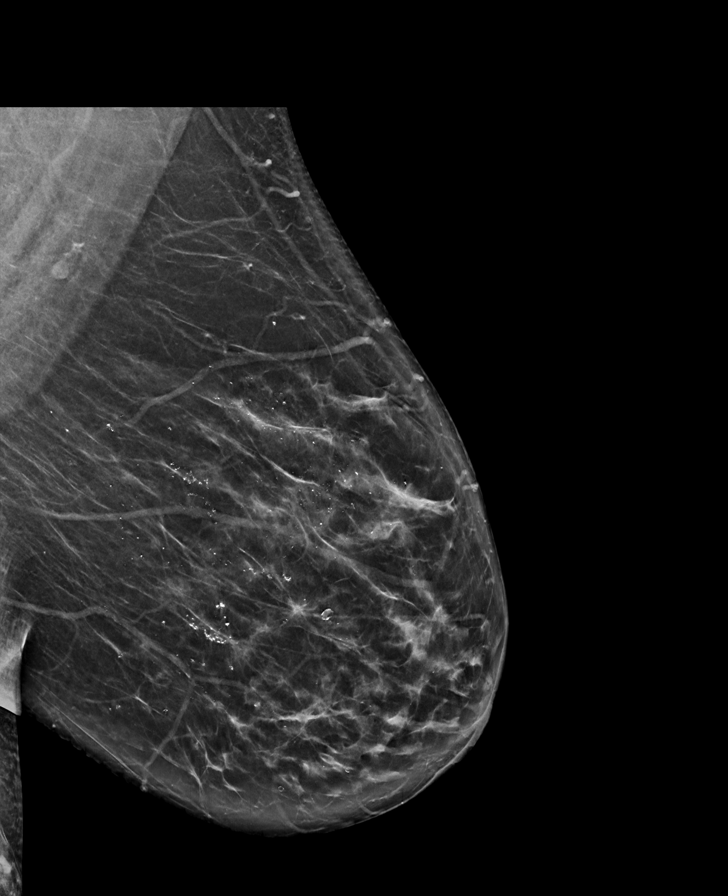

[R MLO synth-2D]
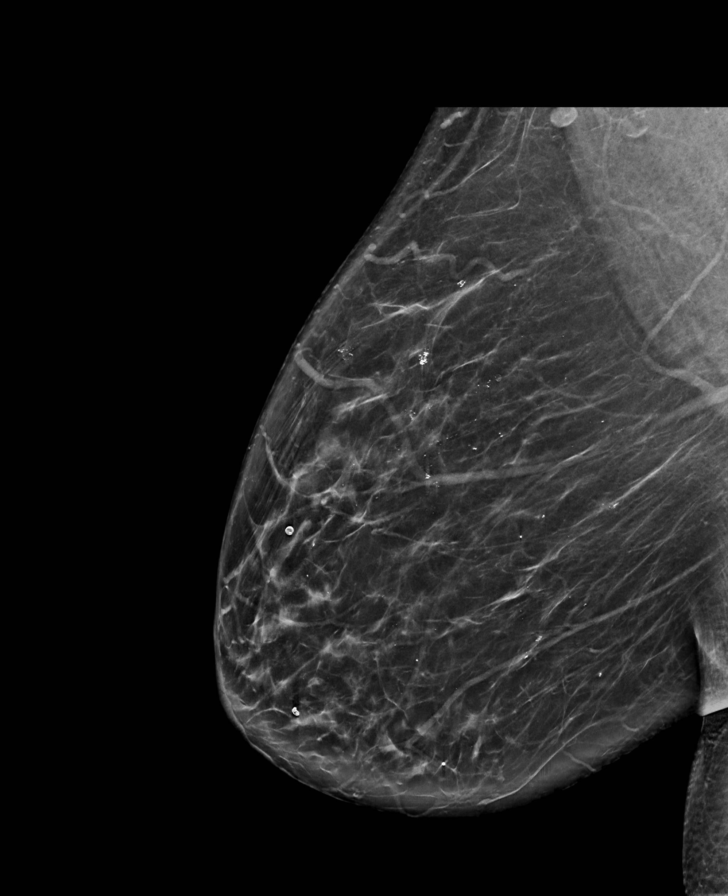

[L CC synth-2D]
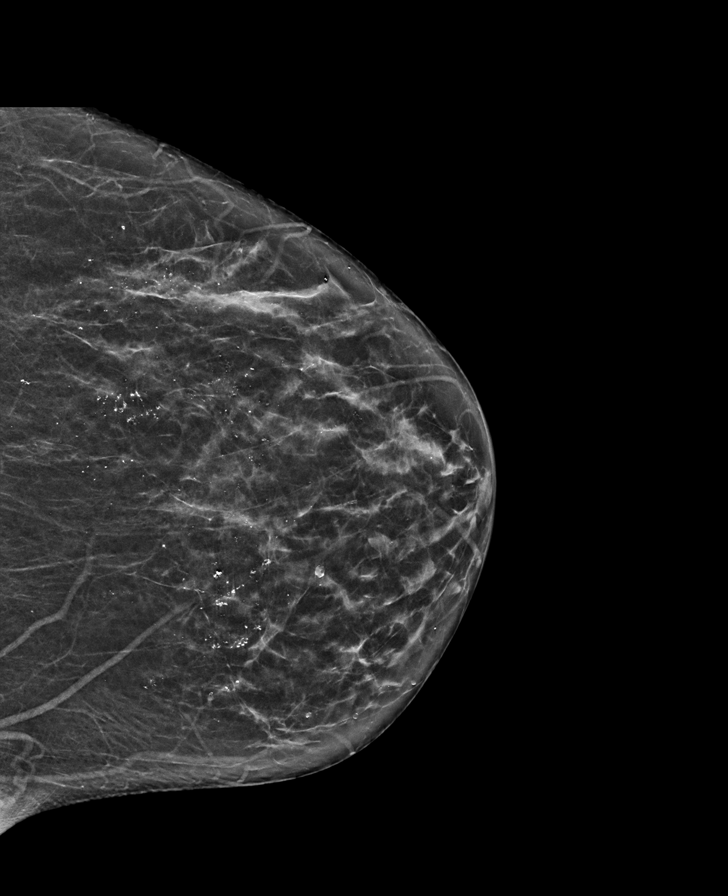

[L MLO tomo · tomo slice 39/76.0]
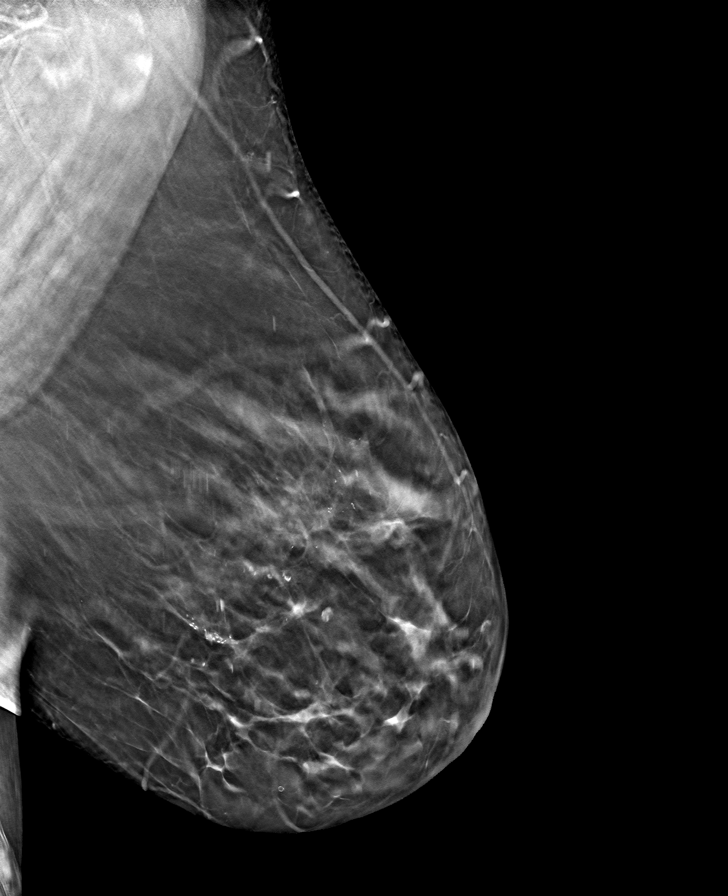

[L CC tomo · tomo slice 33/64.0]
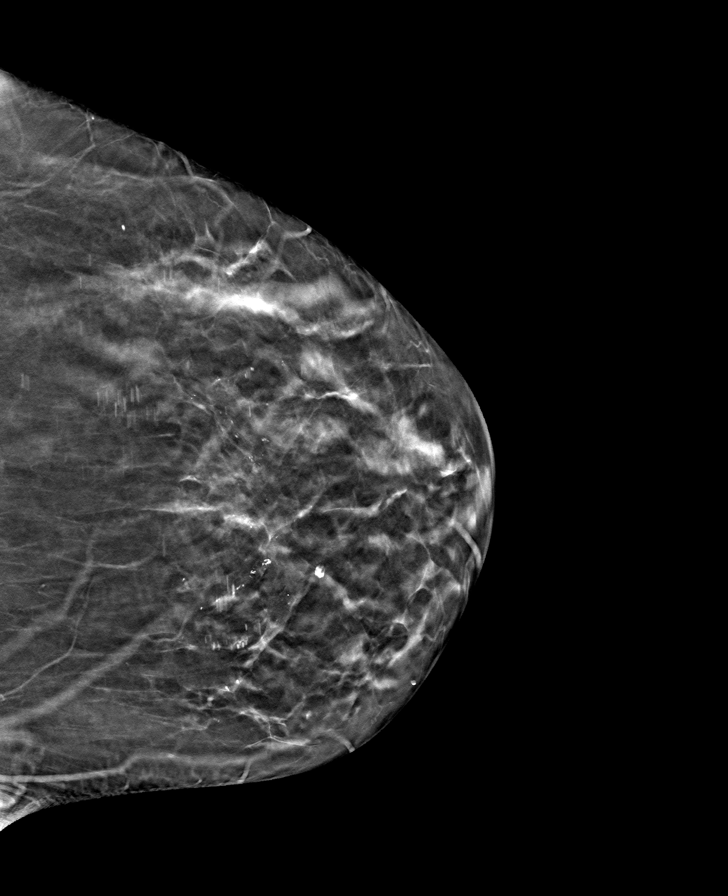

[R CC tomo · tomo slice 32/63.0]
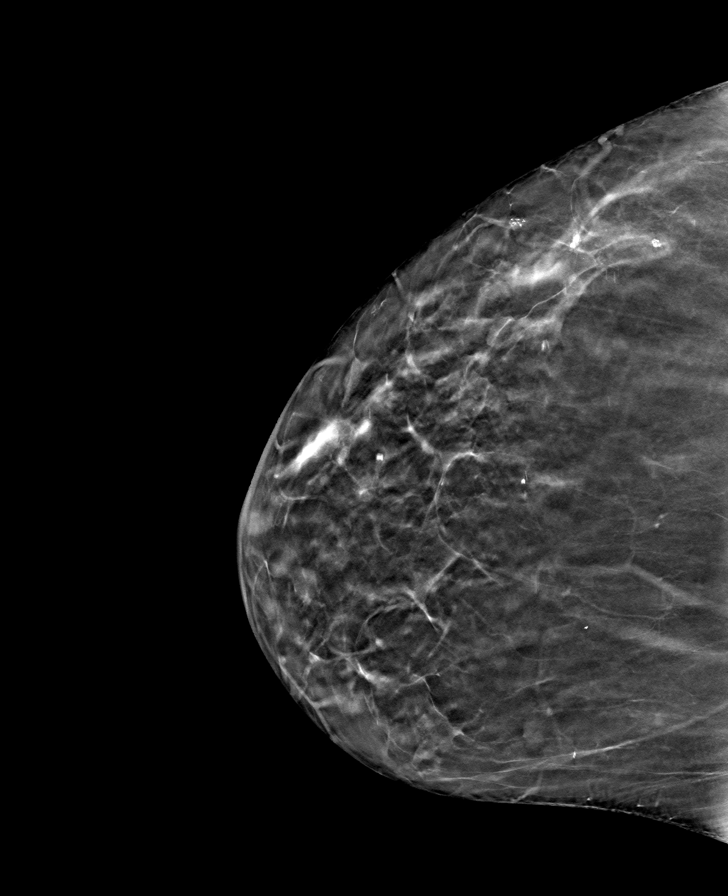

[R MLO tomo · tomo slice 39/77.0]
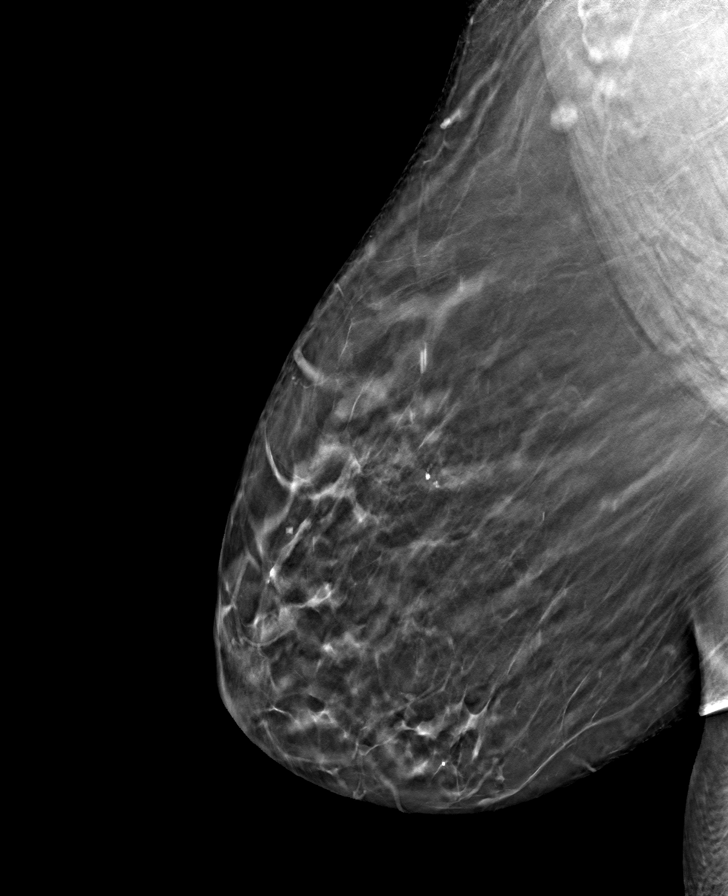

[8 of 24 positions shown; findings below may reference images not displayed]

ACR Breast Density Category b: There are scattered areas of
fibroglandular density.
FINDINGS: There are no findings suspicious for malignancy.
IMPRESSION: No mammographic evidence of malignancy. A result letter of this
screening mammogram will be mailed directly to the patient.

RECOMMENDATION:
Screening mammogram in one year. (Code:51-O-LD2)

BI-RADS CATEGORY  1: Negative.

## 2022-03-09 ENCOUNTER — Other Ambulatory Visit: Payer: Self-pay | Admitting: Physician Assistant

## 2022-03-09 DIAGNOSIS — Z1231 Encounter for screening mammogram for malignant neoplasm of breast: Secondary | ICD-10-CM

## 2022-05-15 ENCOUNTER — Ambulatory Visit
Admission: RE | Admit: 2022-05-15 | Discharge: 2022-05-15 | Disposition: A | Discharge status: Home / Self Care | Payer: Medicare Other | Source: Ambulatory Visit | Attending: Physician Assistant | Admitting: Physician Assistant

## 2022-05-15 DIAGNOSIS — Z1231 Encounter for screening mammogram for malignant neoplasm of breast: Secondary | ICD-10-CM

## 2022-06-04 HISTORY — PX: CATARACT EXTRACTION, BILATERAL: SHX1313

## 2022-10-19 ENCOUNTER — Encounter: Payer: Self-pay | Admitting: Gynecologic Oncology

## 2022-10-19 ENCOUNTER — Inpatient Hospital Stay: Payer: Medicare Other | Attending: Gynecologic Oncology | Admitting: Gynecologic Oncology

## 2022-10-19 VITALS — BP 121/71 | HR 89 | Temp 98.6°F | Resp 14 | Wt 199.0 lb

## 2022-10-19 DIAGNOSIS — Z8542 Personal history of malignant neoplasm of other parts of uterus: Secondary | ICD-10-CM | POA: Diagnosis not present

## 2022-10-19 DIAGNOSIS — B379 Candidiasis, unspecified: Secondary | ICD-10-CM

## 2022-10-19 DIAGNOSIS — Z90722 Acquired absence of ovaries, bilateral: Secondary | ICD-10-CM | POA: Insufficient documentation

## 2022-10-19 DIAGNOSIS — B372 Candidiasis of skin and nail: Secondary | ICD-10-CM | POA: Diagnosis not present

## 2022-10-19 DIAGNOSIS — Z90710 Acquired absence of both cervix and uterus: Secondary | ICD-10-CM | POA: Insufficient documentation

## 2022-10-19 DIAGNOSIS — C541 Malignant neoplasm of endometrium: Secondary | ICD-10-CM

## 2022-10-19 MED ORDER — NYSTATIN 100000 UNIT/GM EX POWD: 1.0000 | Freq: Three times a day (TID) | CUTANEOUS | 1 refills | Status: AC

## 2022-10-19 NOTE — Patient Instructions (Signed)
It was good to see you today.  I do not see or feel any evidence of cancer recurrence on your exam.  I will see you for follow-up in 12 months.  Please call my office sometime after the new year to schedule visit to see me in May 2025.  Please make sure to reach out to your OB/GYN for a visit this fall.  As always, if you develop any new and concerning symptoms before your next visit, please call to see me sooner.

## 2022-10-19 NOTE — Progress Notes (Signed)
Gynecologic Oncology Return Clinic Visit  10/19/22  Reason for Visit: surveillance visit in the setting of early-stage endometrial cancer   Treatment History: Oncology History Overview Note  MMR IHC preserved MSI-stable   Endometrial cancer ([facility])  08/19/2019 Initial Biopsy   EMB - gr1 EMCA   08/19/2019 Initial Diagnosis   Endometrial cancer ([facility])   09/11/2019 Surgery   TRH/BSO, SLN   09/11/2019 Pathologic Stage   IA, grade 1 endometrioid, SLNs negative, no LVSI     Interval History: Doing well.  Denies any vaginal bleeding or discharge.  Reports baseline bowel bladder function.  Denies any abdominal or pelvic pain.  Has now undergone bilateral cataract surgery, second eye was completed about 2 weeks ago.  Past Medical/Surgical History: Past Medical History:  Diagnosis Date   Diabetes mellitus without complication ([facility])    type 2   Endometrial cancer ([facility])    High cholesterol    Hypertension    Obesity (BMI 30.0-34.9)     Past Surgical History:  Procedure Laterality Date   COLONOSCOPY     ROBOTIC ASSISTED TOTAL HYSTERECTOMY WITH BILATERAL SALPINGO OOPHERECTOMY Bilateral 09/11/2019   Procedure: XI ROBOTIC ASSISTED TOTAL HYSTERECTOMY WITH BILATERAL SALPINGO OOPHORECTOMY;  Surgeon: Carver Fila, MD;  Location: [facility];  Service: Gynecology;  Laterality: Bilateral;   SENTINEL NODE BIOPSY N/A 09/11/2019   Procedure: SENTINEL NODE BIOPSY;  Surgeon: Carver Fila, MD;  Location: [facility];  Service: Gynecology;  Laterality: N/A;   TOTAL KNEE ARTHROPLASTY Right 11/11/2012   Procedure: RIGHT TOTAL KNEE ARTHROPLASTY;  Surgeon: Shelda Pal, MD;  Location: [facility];  Service: Orthopedics;  Laterality: Right;   TOTAL KNEE ARTHROPLASTY Left 12/09/2012   Procedure: LEFT TOTAL KNEE ARTHROPLASTY;  Surgeon: Shelda Pal, MD;  Location: [facility];  Service: Orthopedics;  Laterality: Left;   WISDOM TOOTH EXTRACTION      Family History  Problem Relation Age of Onset   Hypertension  Mother    Hyperlipidemia Mother    Diabetes Father    Hypertension Father    Hypertension Paternal Grandmother    Diabetes Paternal Grandmother    Heart attack Paternal Grandmother    Hypertension Paternal Grandfather    Colon cancer Neg Hx    Uterine cancer Neg Hx    Breast cancer Neg Hx    Ovarian cancer Neg Hx     Social History   Socioeconomic History   Marital status: Divorced    Spouse name: Not on file   Number of children: Not on file   Years of education: Not on file   Highest education level: Not on file  Occupational History   Not on file  Tobacco Use   Smoking status: Never   Smokeless tobacco: Never  Vaping Use   Vaping Use: Never used  Substance and Sexual Activity   Alcohol use: No   Drug use: No   Sexual activity: Not Currently  Other Topics Concern   Not on file  Social History Narrative   Not on file   Social Determinants of Health   Financial Resource Strain: Not on file  Food Insecurity: Not on file  Transportation Needs: Not on file  Physical Activity: Not on file  Stress: Not on file  Social Connections: Not on file    Current Medications:  Current Outpatient Medications:    aspirin EC 81 MG tablet, Take 81 mg by mouth daily. Swallow whole., Disp: , Rfl:    BESIVANCE 0.6 % SUSP, Place 1 drop into  the right eye 3 (three) times daily. For two more weeks using since cataract surgery, Disp: , Rfl:    Cholecalciferol (VITAMIN D3) 50 MCG (2000 UT) TABS, Take 2,000 Units by mouth daily., Disp: , Rfl:    clindamycin (CLEOCIN) 300 MG capsule, Take 900 mg by mouth See admin instructions. TAKE 3 CAPSULES (900 MG) BY MOUTH 1 HOUR PRIOR TO DENTAL APPOINTMENTS, Disp: , Rfl:    DUREZOL 0.05 % EMUL, Place 1 drop into the right eye 3 (three) times daily., Disp: , Rfl:    ezetimibe-simvastatin (VYTORIN) 10-40 MG per tablet, Take 1 tablet by mouth at bedtime., Disp: , Rfl:    JARDIANCE 25 MG TABS tablet, Take 25 mg by mouth daily., Disp: , Rfl:     lisinopril-hydrochlorothiazide (ZESTORETIC) 20-12.5 MG tablet, Take 1 tablet by mouth daily., Disp: , Rfl:    metFORMIN (GLUCOPHAGE) 1000 MG tablet, Take 1,000 mg by mouth 2 (two) times daily., Disp: , Rfl:    Multiple Vitamins-Minerals (VITEYES AREDS FORMULA PO), Take by mouth., Disp: , Rfl:    nystatin (MYCOSTATIN/NYSTOP) powder, Apply 1 Application topically 3 (three) times daily., Disp: 15 g, Rfl: 1   OZEMPIC, 0.25 OR 0.5 MG/DOSE, 2 MG/1.5ML SOPN, Inject 0.5 mg into the skin every Sunday. , Disp: , Rfl:    PROLENSA 0.07 % SOLN, Place 1 drop into the right eye at bedtime., Disp: , Rfl:   Review of Systems: Denies appetite changes, fevers, chills, fatigue, unexplained weight changes. Denies hearing loss, neck lumps or masses, mouth sores, ringing in ears or voice changes. Denies cough or wheezing.  Denies shortness of breath. Denies chest pain or palpitations. Denies leg swelling. Denies abdominal distention, pain, blood in stools, constipation, diarrhea, nausea, vomiting, or early satiety. Denies pain with intercourse, dysuria, frequency, hematuria or incontinence. Denies hot flashes, pelvic pain, vaginal bleeding or vaginal discharge.   Denies joint pain, back pain or muscle pain/cramps. Denies itching, rash, or wounds. Denies dizziness, headaches, numbness or seizures. Denies swollen lymph nodes or glands, denies easy bruising or bleeding. Denies anxiety, depression, confusion, or decreased concentration.  Physical Exam: BP 121/71 (BP Location: Left Wrist, Patient Position: Sitting)   Pulse 89   Temp 98.6 F (37 C) (Oral)   Resp 14   Wt 199 lb (90.3 kg)   SpO2 98%   BMI 33.12 kg/m  General: Alert, oriented, no acute distress. HEENT: Normocephalic, atraumatic, sclera anicteric. Chest: Clear to auscultation bilaterally.  No rhonchi or wheezes. Cardiovascular: Regular rate and rhythm, no murmurs. Abdomen: Obese, soft, nontender.  Normoactive bowel sounds.  No masses or  hepatosplenomegaly appreciated.  Well-healed incisions. Extremities: Grossly normal range of motion.  Warm, well perfused.  No edema bilaterally. Skin: Erythema noted under the pannus consistent with Candida infection. Lymphatics: No cervical, supraclavicular, or inguinal adenopathy. GU: Normal appearing external genitalia without erythema, excoriation, or lesions.  Speculum exam reveals mildly atrophic vaginal mucosa, no lesions or masses, no discharge or bleeding.  Bimanual exam reveals cuff intact, no masses or nodularity.  Rectovaginal exam confirms these findings.  Laboratory & Radiologic Studies: None new  Assessment & Plan: Tracey Foster is a 67 y.o. woman with Stage IA2 grade 1 low risk endometrial adenocarcinoma who presents for follow-up.  Definitive survey 09/2019. MMR intact, MSS.    Patient is doing well and is NED on exam today.    Prescription sent for nystatin powder given Candida infection.   Per NCCN surveillance recommendations, we will continue with visits every 6 months.  She is now more than 3 years out from definitive surgery.  Will continue alternating visits every 6 months between my office and her OB/GYN until 5 years.  At that point, she will transition to yearly visits with her OB/GYN.   I will see her next in a year.  I asked her to call after the new year to get a visit scheduled with me in May 2025.  I reviewed signs and symptoms again that should prompt a phone call prior to her next scheduled visit.   Routine pap tests are not indicated.  20 minutes of total time was spent for this patient encounter, including preparation, face-to-face counseling with the patient and coordination of care, and documentation of the encounter.  Eugene Garnet, MD  Division of Gynecologic Oncology  Department of Obstetrics and Gynecology  [facility]

## 2022-11-01 ENCOUNTER — Other Ambulatory Visit: Payer: Self-pay | Admitting: Physician Assistant

## 2022-11-01 DIAGNOSIS — Z13820 Encounter for screening for osteoporosis: Secondary | ICD-10-CM

## 2023-04-04 ENCOUNTER — Other Ambulatory Visit: Payer: Self-pay | Admitting: Physician Assistant

## 2023-04-04 DIAGNOSIS — Z0000 Encounter for general adult medical examination without abnormal findings: Secondary | ICD-10-CM

## 2023-05-22 ENCOUNTER — Ambulatory Visit
Admission: RE | Admit: 2023-05-22 | Discharge: 2023-05-22 | Disposition: A | Discharge status: Home / Self Care | Payer: Medicare Other | Source: Ambulatory Visit | Attending: Physician Assistant | Admitting: Physician Assistant

## 2023-05-22 DIAGNOSIS — Z0000 Encounter for general adult medical examination without abnormal findings: Secondary | ICD-10-CM

## 2023-05-22 DIAGNOSIS — Z13820 Encounter for screening for osteoporosis: Secondary | ICD-10-CM

## 2023-06-06 ENCOUNTER — Telehealth: Payer: Self-pay | Admitting: *Deleted

## 2023-06-06 NOTE — Telephone Encounter (Signed)
 Pt called to schedule her annual visit with Dr. Pricilla Holm. Pt was given an appt. For Friday, May 2nd at 2 pm. Pt agreed to date and time and had no further concerns at this time.

## 2023-08-27 ENCOUNTER — Telehealth: Payer: Self-pay | Admitting: *Deleted

## 2023-08-27 NOTE — Telephone Encounter (Signed)
 Spoke with the patient and moved appt from 5/2 to 5/15

## 2023-10-04 ENCOUNTER — Ambulatory Visit: Payer: Medicare Other | Admitting: Gynecologic Oncology

## 2023-10-16 ENCOUNTER — Encounter: Payer: Self-pay | Admitting: Gynecologic Oncology

## 2023-10-17 ENCOUNTER — Encounter: Payer: Self-pay | Admitting: Gynecologic Oncology

## 2023-10-17 ENCOUNTER — Inpatient Hospital Stay: Attending: Gynecologic Oncology | Admitting: Gynecologic Oncology

## 2023-10-17 VITALS — BP 112/68 | HR 98 | Temp 97.6°F | Resp 18 | Wt 182.2 lb

## 2023-10-17 DIAGNOSIS — Z90722 Acquired absence of ovaries, bilateral: Secondary | ICD-10-CM | POA: Insufficient documentation

## 2023-10-17 DIAGNOSIS — Z8542 Personal history of malignant neoplasm of other parts of uterus: Secondary | ICD-10-CM | POA: Insufficient documentation

## 2023-10-17 DIAGNOSIS — Z9079 Acquired absence of other genital organ(s): Secondary | ICD-10-CM | POA: Diagnosis not present

## 2023-10-17 DIAGNOSIS — Z90710 Acquired absence of both cervix and uterus: Secondary | ICD-10-CM | POA: Diagnosis not present

## 2023-10-17 DIAGNOSIS — Z08 Encounter for follow-up examination after completed treatment for malignant neoplasm: Secondary | ICD-10-CM | POA: Insufficient documentation

## 2023-10-17 DIAGNOSIS — C541 Malignant neoplasm of endometrium: Secondary | ICD-10-CM

## 2023-10-17 NOTE — Progress Notes (Signed)
 Gynecologic Oncology Return Clinic Visit  10/17/23  Reason for Visit: surveillance visit in the setting of early-stage endometrial cancer   Treatment History: Oncology History Overview Note  MMR IHC preserved MSI-stable   Endometrial cancer (HCC)  08/19/2019 Initial Biopsy   EMB - gr1 EMCA   08/19/2019 Initial Diagnosis   Endometrial cancer (HCC)   09/11/2019 Surgery   TRH/BSO, SLN   09/11/2019 Pathologic Stage   IA, grade 1 endometrioid, SLNs negative, no LVSI     Interval History: Doing well.  Denies any vaginal bleeding, pelvic pain, change to bowel or bladder function.  Hoping to have her other knee replaced within the next year.  Past Medical/Surgical History: Past Medical History:  Diagnosis Date   Diabetes mellitus without complication (HCC)    type 2   Endometrial cancer (HCC)    High cholesterol    Hypertension    Obesity (BMI 30.0-34.9)     Past Surgical History:  Procedure Laterality Date   CATARACT EXTRACTION, BILATERAL  2024   COLONOSCOPY     ROBOTIC ASSISTED TOTAL HYSTERECTOMY WITH BILATERAL SALPINGO OOPHERECTOMY Bilateral 09/11/2019   Procedure: XI ROBOTIC ASSISTED TOTAL HYSTERECTOMY WITH BILATERAL SALPINGO OOPHORECTOMY;  Surgeon: Suzi Essex, MD;  Location: [facility];  Service: Gynecology;  Laterality: Bilateral;   SENTINEL NODE BIOPSY N/A 09/11/2019   Procedure: SENTINEL NODE BIOPSY;  Surgeon: Suzi Essex, MD;  Location: [facility];  Service: Gynecology;  Laterality: N/A;   TOTAL KNEE ARTHROPLASTY Right 11/11/2012   Procedure: RIGHT TOTAL KNEE ARTHROPLASTY;  Surgeon: Bevin Bucks, MD;  Location: [facility];  Service: Orthopedics;  Laterality: Right;   TOTAL KNEE ARTHROPLASTY Left 12/09/2012   Procedure: LEFT TOTAL KNEE ARTHROPLASTY;  Surgeon: Bevin Bucks, MD;  Location: [facility];  Service: Orthopedics;  Laterality: Left;   WISDOM TOOTH EXTRACTION      Family History  Problem Relation Age of Onset   Hypertension Mother    Hyperlipidemia  Mother    Diabetes Father    Hypertension Father    Hypertension Paternal Grandmother    Diabetes Paternal Grandmother    Heart attack Paternal Grandmother    Hypertension Paternal Grandfather    Colon cancer Neg Hx    Uterine cancer Neg Hx    Breast cancer Neg Hx    Ovarian cancer Neg Hx     Social History   Socioeconomic History   Marital status: Divorced    Spouse name: Not on file   Number of children: Not on file   Years of education: Not on file   Highest education level: Not on file  Occupational History   Not on file  Tobacco Use   Smoking status: Never   Smokeless tobacco: Never  Vaping Use   Vaping status: Never Used  Substance and Sexual Activity   Alcohol use: No   Drug use: No   Sexual activity: Not Currently  Other Topics Concern   Not on file  Social History Narrative   Not on file   Social Drivers of Health   Financial Resource Strain: Not on file  Food Insecurity: Not on file  Transportation Needs: Not on file  Physical Activity: Not on file  Stress: Not on file  Social Connections: Not on file    Current Medications:  Current Outpatient Medications:    Cholecalciferol (VITAMIN D3) 50 MCG (2000 UT) TABS, Take 2,000 Units by mouth daily., Disp: , Rfl:    clindamycin  (CLEOCIN ) 300 MG capsule, Take 900 mg by mouth  See admin instructions. TAKE 3 CAPSULES (900 MG) BY MOUTH 1 HOUR PRIOR TO DENTAL APPOINTMENTS, Disp: , Rfl:    ezetimibe -simvastatin  (VYTORIN ) 10-40 MG per tablet, Take 1 tablet by mouth at bedtime., Disp: , Rfl:    ferrous sulfate  324 MG TBEC, Take 324 mg by mouth., Disp: , Rfl:    JARDIANCE 25 MG TABS tablet, Take 25 mg by mouth daily., Disp: , Rfl:    lisinopril -hydrochlorothiazide  (ZESTORETIC) 10-12.5 MG tablet, 1 tablet Orally Once a day for 90 days, Disp: , Rfl:    metFORMIN (GLUCOPHAGE) 1000 MG tablet, Take 1,000 mg by mouth 2 (two) times daily., Disp: , Rfl:    Multiple Vitamins-Minerals (VITEYES AREDS FORMULA PO), Take by  mouth., Disp: , Rfl:    nystatin  (MYCOSTATIN /NYSTOP ) powder, Apply 1 Application topically 3 (three) times daily., Disp: 15 g, Rfl: 1   OZEMPIC, 0.25 OR 0.5 MG/DOSE, 2 MG/1.5ML SOPN, Inject 0.5 mg into the skin every Sunday. , Disp: , Rfl:   Review of Systems: Denies appetite changes, fevers, chills, fatigue, unexplained weight changes. Denies hearing loss, neck lumps or masses, mouth sores, ringing in ears or voice changes. Denies cough or wheezing.  Denies shortness of breath. Denies chest pain or palpitations. Denies leg swelling. Denies abdominal distention, pain, blood in stools, constipation, diarrhea, nausea, vomiting, or early satiety. Denies pain with intercourse, dysuria, frequency, hematuria or incontinence. Denies hot flashes, pelvic pain, vaginal bleeding or vaginal discharge.   Denies joint pain, back pain or muscle pain/cramps. Denies itching, rash, or wounds. Denies dizziness, headaches, numbness or seizures. Denies swollen lymph nodes or glands, denies easy bruising or bleeding. Denies anxiety, depression, confusion, or decreased concentration.  Physical Exam: BP 112/68   Pulse 98   Temp 97.6 F (36.4 C)   Resp 18   Wt 182 lb 3.2 oz (82.6 kg)   SpO2 98%   BMI 30.32 kg/m  General: Alert, oriented, no acute distress. HEENT: Normocephalic, atraumatic, sclera anicteric. Chest: Clear to auscultation bilaterally.  No rhonchi or wheezes. Cardiovascular: Regular rate and rhythm, no murmurs. Abdomen: Obese, soft, nontender.  Normoactive bowel sounds.  No masses or hepatosplenomegaly appreciated.  Well-healed incisions. Extremities: Grossly normal range of motion.  Warm, well perfused.  No edema bilaterally. Skin: Erythema noted under the pannus consistent with Candida infection. Lymphatics: No cervical, supraclavicular, or inguinal adenopathy. GU: Normal appearing external genitalia without erythema, excoriation, or lesions.  Speculum exam reveals mildly atrophic vaginal  mucosa, no lesions or masses, no discharge or bleeding.  Laxity of the vaginal apex. Bimanual exam reveals cuff intact, no masses or nodularity.    Laboratory & Radiologic Studies: None new  Assessment & Plan: Tracey Foster is a 68 y.o. 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  Definitive survey 09/2019. MMR intact, MSS.    Patient is doing well and is NED on exam today.     Per NCCN surveillance recommendations, we will continue with visits every 6 months.  Will continue alternating visits every 6 months between my office and her OB/GYN until 5 years.  At that point, she will transition to yearly visits with her OB/GYN.   We will see her next in a year.  I reviewed signs and symptoms again that should prompt a phone call prior to her next scheduled visit.   Routine pap tests are not indicated.  20 minutes of total time was spent for this patient encounter, including preparation, face-to-face counseling with  the patient and coordination of care, and documentation of the encounter.  Wiley Hanger, MD  Division of Gynecologic Oncology  Department of Obstetrics and Gynecology  [facility]

## 2023-10-17 NOTE — Patient Instructions (Signed)
 It was good to see you today.  I do not see or feel any evidence of cancer recurrence on your exam.  We will see you for follow-up in 12 months.  Please make an appointment with your OB/GYN in 6 months if you do not already have one.  As always, if you develop any new and concerning symptoms before your next visit, please call to see me sooner.

## 2023-12-23 NOTE — Patient Instructions (Signed)
 SURGICAL WAITING ROOM VISITATION  Patients having surgery or a procedure may have no more than 2 support people in the waiting area - these visitors may rotate.    Children under the age of 30 must have an adult with them who is not the patient.  Visitors with respiratory illnesses are discouraged from visiting and should remain at home.  If the patient needs to stay at the hospital during part of their recovery, the visitor guidelines for inpatient rooms apply. Pre-op nurse will coordinate an appropriate time for 1 support person to accompany patient in pre-op.  This support person may not rotate.    Please refer to the [facility] website for the visitor guidelines for Inpatients (after your surgery is over and you are in a regular room).       Your procedure is scheduled on: 01/02/24   Report to [facility] Main Entrance    Report to admitting at : 5:15 AM   Call this number if you have problems the morning of surgery [phone]   Do not eat food :After Midnight.   After Midnight you may have the following liquids until : 4:30 AM DAY OF SURGERY  Water  Non-Citrus Juices (without pulp, NO RED-Apple, White grape, White cranberry) Black Coffee (NO MILK/CREAM OR CREAMERS, sugar ok)  Clear Tea (NO MILK/CREAM OR CREAMERS, sugar ok) regular and decaf                             Plain Jell-O (NO RED)                                           Fruit ices (not with fruit pulp, NO RED)                                     Popsicles (NO RED)                                                               Sports drinks like Gatorade (NO RED)   The day of surgery:  Drink ONE (1) Pre-Surgery Clear G2 at: 4:30 AM the morning of surgery. Drink in one sitting. Do not sip.  This drink was given to you during your hospital  pre-op appointment visit. Nothing else to drink after completing the  Pre-Surgery Clear Ensure or G2.          If you have questions, please contact your surgeon's  office.  FOLLOW ANY ADDITIONAL PRE OP INSTRUCTIONS YOU RECEIVED FROM YOUR SURGEON'S OFFICE!!!   Oral Hygiene is also important to reduce your risk of infection.                                    Remember - BRUSH YOUR TEETH THE MORNING OF SURGERY WITH YOUR REGULAR TOOTHPASTE  DENTURES WILL BE REMOVED PRIOR TO SURGERY PLEASE DO NOT APPLY Poly grip OR ADHESIVES!!!   Do NOT smoke after Midnight   Stop all vitamins  and herbal supplements 7 days before surgery.   Take these medicines the morning of surgery with A SIP OF WATER : NONE  How to Manage Your Diabetes Before and After Surgery  Why is it important to control my blood sugar before and after surgery? Improving blood sugar levels before and after surgery helps healing and can limit problems. A way of improving blood sugar control is eating a healthy diet by:  Eating less sugar and carbohydrates  Increasing activity/exercise  Talking with your doctor about reaching your blood sugar goals High blood sugars (greater than 180 mg/dL) can raise your risk of infections and slow your recovery, so you will need to focus on controlling your diabetes during the weeks before surgery. Make sure that the doctor who takes care of your diabetes knows about your planned surgery including the date and location.  How do I manage my blood sugar before surgery? Check your blood sugar at least 4 times a day, starting 2 days before surgery, to make sure that the level is not too high or low. Check your blood sugar the morning of your surgery when you wake up and every 2 hours until you get to the Short Stay unit. If your blood sugar is less than 70 mg/dL, you will need to treat for low blood sugar: Do not take insulin . Treat a low blood sugar (less than 70 mg/dL) with  cup of clear juice (cranberry or apple), 4 glucose tablets, OR glucose gel. Recheck blood sugar in 15 minutes after treatment (to make sure it is greater than 70 mg/dL). If your blood  sugar is not greater than 70 mg/dL on recheck, call [phone] for further instructions. Report your blood sugar to the short stay nurse when you get to Short Stay.  If you are admitted to the hospital after surgery: Your blood sugar will be checked by the staff and you will probably be given insulin  after surgery (instead of oral diabetes medicines) to make sure you have good blood sugar levels. The goal for blood sugar control after surgery is 80-180 mg/dL.   WHAT DO I DO ABOUT MY DIABETES MEDICATION?  HOLD jardiance after: 12/29/23    THE MORNING OF SURGERY, DO NOT TAKE ANY ORAL DIABETIC MEDICATIONS DAY OF YOUR SURGERY  DO NOT TAKE THE FOLLOWING 7 DAYS PRIOR TO SURGERY: Ozempic, Wegovy, Rybelsus (Semaglutide), Byetta (exenatide), Bydureon (exenatide ER), Victoza, Saxenda (liraglutide), or Trulicity (dulaglutide) Mounjaro (Tirzepatide) Adlyxin (Lixisenatide), Polyethylene Glycol Loxenatide. HOLD Ozempic after: 12/25/23    Bring CPAP mask and tubing day of surgery.                              You may not have any metal on your body including hair pins, jewelry, and body piercing             Do not wear make-up, lotions, powders, perfumes/cologne, or deodorant  Do not wear nail polish including gel and S&S, artificial/acrylic nails, or any other type of covering on natural nails including finger and toenails. If you have artificial nails, gel coating, etc. that needs to be removed by a nail salon please have this removed prior to surgery or surgery may need to be canceled/ delayed if the surgeon/ anesthesia feels like they are unable to be safely monitored.   Do not shave  48 hours prior to surgery.    Do not bring valuables to the hospital. [facility] IS NOT  RESPONSIBLE   FOR VALUABLES.   Contacts, glasses, dentures or bridgework may not be worn into surgery.   Bring small overnight bag day of surgery.   DO NOT BRING YOUR HOME MEDICATIONS TO THE HOSPITAL. PHARMACY  WILL DISPENSE MEDICATIONS LISTED ON YOUR MEDICATION LIST TO YOU DURING YOUR ADMISSION IN THE HOSPITAL!    Patients discharged on the day of surgery will not be allowed to drive home.  Someone NEEDS to stay with you for the first 24 hours after anesthesia.   Special Instructions: Bring a copy of your healthcare power of attorney and living will documents the day of surgery if you haven't scanned them before.              Please read over the following fact sheets you were given: IF YOU HAVE QUESTIONS ABOUT YOUR PRE-OP INSTRUCTIONS PLEASE CALL [phone].   If you received a COVID test during your pre-op visit  it is requested that you wear a mask when out in public, stay away from anyone that may not be feeling well and notify your surgeon if you develop symptoms. If you test positive for Covid or have been in contact with anyone that has tested positive in the last 10 days please notify you surgeon.      Pre-operative 5 CHG Bath Instructions   You can play a key role in reducing the risk of infection after surgery. Your skin needs to be as free of germs as possible. You can reduce the number of germs on your skin by washing with CHG (chlorhexidine  gluconate) soap before surgery. CHG is an antiseptic soap that kills germs and continues to kill germs even after washing.   DO NOT use if you have an allergy to chlorhexidine /CHG or antibacterial soaps. If your skin becomes reddened or irritated, stop using the CHG and notify one of our RNs at [phone].   Please shower with the CHG soap starting 4 days before surgery using the following schedule:     Please keep in mind the following:  DO NOT shave, including legs and underarms, starting the day of your first shower.   You may shave your face at any point before/day of surgery.  Place clean sheets on your bed the day you start using CHG soap. Use a clean washcloth (not used since being washed) for each shower. DO NOT sleep with pets once you  start using the CHG.   CHG Shower Instructions:  If you choose to wash your hair and private area, wash first with your normal shampoo/soap.  After you use shampoo/soap, rinse your hair and body thoroughly to remove shampoo/soap residue.  Turn the water  OFF and apply about 3 tablespoons (45 ml) of CHG soap to a CLEAN washcloth.  Apply CHG soap ONLY FROM YOUR NECK DOWN TO YOUR TOES (washing for 3-5 minutes)  DO NOT use CHG soap on face, private areas, open wounds, or sores.  Pay special attention to the area where your surgery is being performed.  If you are having back surgery, having someone wash your back for you may be helpful. Wait 2 minutes after CHG soap is applied, then you may rinse off the CHG soap.  Pat dry with a clean towel  Put on clean clothes/pajamas   If you choose to wear lotion, please use ONLY the CHG-compatible lotions on the back of this paper.     Additional instructions for the day of surgery: DO NOT APPLY any lotions, deodorants, cologne, or perfumes.  Put on clean/comfortable clothes.  Brush your teeth.  Ask your nurse before applying any prescription medications to the skin.   CHG Compatible Lotions   Aveeno Moisturizing lotion  Cetaphil Moisturizing Cream  Cetaphil Moisturizing Lotion  Clairol Herbal Essence Moisturizing Lotion, Dry Skin  Clairol Herbal Essence Moisturizing Lotion, Extra Dry Skin  Clairol Herbal Essence Moisturizing Lotion, Normal Skin  Curel Age Defying Therapeutic Moisturizing Lotion with Alpha Hydroxy  Curel Extreme Care Body Lotion  Curel Soothing Hands Moisturizing Hand Lotion  Curel Therapeutic Moisturizing Cream, Fragrance-Free  Curel Therapeutic Moisturizing Lotion, Fragrance-Free  Curel Therapeutic Moisturizing Lotion, Original Formula  Eucerin Daily Replenishing Lotion  Eucerin Dry Skin Therapy Plus Alpha Hydroxy Crme  Eucerin Dry Skin Therapy Plus Alpha Hydroxy Lotion  Eucerin Original Crme  Eucerin Original Lotion   Eucerin Plus Crme Eucerin Plus Lotion  Eucerin TriLipid Replenishing Lotion  Keri Anti-Bacterial Hand Lotion  Keri Deep Conditioning Original Lotion Dry Skin Formula Softly Scented  Keri Deep Conditioning Original Lotion, Fragrance Free Sensitive Skin Formula  Keri Lotion Fast Absorbing Fragrance Free Sensitive Skin Formula  Keri Lotion Fast Absorbing Softly Scented Dry Skin Formula  Keri Original Lotion  Keri Skin Renewal Lotion Keri Silky Smooth Lotion  Keri Silky Smooth Sensitive Skin Lotion  Nivea Body Creamy Conditioning Oil  Nivea Body Extra Enriched Lotion  Nivea Body Original Lotion  Nivea Body Sheer Moisturizing Lotion Nivea Crme  Nivea Skin Firming Lotion  NutraDerm 30 Skin Lotion  NutraDerm Skin Lotion  NutraDerm Therapeutic Skin Cream  NutraDerm Therapeutic Skin Lotion  ProShield Protective Hand Cream  Provon moisturizing lotion   Incentive Spirometer  An incentive spirometer is a tool that can help keep your lungs clear and active. This tool measures how well you are filling your lungs with each breath. Taking long deep breaths may help reverse or decrease the chance of developing breathing (pulmonary) problems (especially infection) following: A long period of time when you are unable to move or be active. BEFORE THE PROCEDURE  If the spirometer includes an indicator to show your best effort, your nurse or respiratory therapist will set it to a desired goal. If possible, sit up straight or lean slightly forward. Try not to slouch. Hold the incentive spirometer in an upright position. INSTRUCTIONS FOR USE  Sit on the edge of your bed if possible, or sit up as far as you can in bed or on a chair. Hold the incentive spirometer in an upright position. Breathe out normally. Place the mouthpiece in your mouth and seal your lips tightly around it. Breathe in slowly and as deeply as possible, raising the piston or the ball toward the top of the column. Hold your breath for  3-5 seconds or for as long as possible. Allow the piston or ball to fall to the bottom of the column. Remove the mouthpiece from your mouth and breathe out normally. Rest for a few seconds and repeat Steps 1 through 7 at least 10 times every 1-2 hours when you are awake. Take your time and take a few normal breaths between deep breaths. The spirometer may include an indicator to show your best effort. Use the indicator as a goal to work toward during each repetition. After each set of 10 deep breaths, practice coughing to be sure your lungs are clear. If you have an incision (the cut made at the time of surgery), support your incision when coughing by placing a pillow or rolled up towels firmly against it. Once you are  able to get out of bed, walk around indoors and cough well. You may stop using the incentive spirometer when instructed by your caregiver.  RISKS AND COMPLICATIONS Take your time so you do not get dizzy or light-headed. If you are in pain, you may need to take or ask for pain medication before doing incentive spirometry. It is harder to take a deep breath if you are having pain. AFTER USE Rest and breathe slowly and easily. It can be helpful to keep track of a log of your progress. Your caregiver can provide you with a simple table to help with this. If you are using the spirometer at home, follow these instructions: SEEK MEDICAL CARE IF:  You are having difficultly using the spirometer. You have trouble using the spirometer as often as instructed. Your pain medication is not giving enough relief while using the spirometer. You develop fever of 100.5 F (38.1 C) or higher. SEEK IMMEDIATE MEDICAL CARE IF:  You cough up bloody sputum that had not been present before. You develop fever of 102 F (38.9 C) or greater. You develop worsening pain at or near the incision site. MAKE SURE YOU:  Understand these instructions. Will watch your condition. Will get help right away if you  are not doing well or get worse. Document Released: 10/01/2006 Document Revised: 08/13/2011 Document Reviewed: 12/02/2006 [employer] Patient Information 2014 [place], [place].   ________________________________________________________________________

## 2023-12-24 ENCOUNTER — Other Ambulatory Visit: Payer: Self-pay

## 2023-12-24 ENCOUNTER — Encounter (HOSPITAL_COMMUNITY): Payer: Self-pay

## 2023-12-24 ENCOUNTER — Encounter (HOSPITAL_COMMUNITY)
Admission: RE | Admit: 2023-12-24 | Discharge: 2023-12-24 | Disposition: A | Discharge status: Home / Self Care | Source: Ambulatory Visit | Attending: Orthopedic Surgery

## 2023-12-24 VITALS — BP 112/68 | HR 80 | Temp 98.1°F | Ht 64.0 in | Wt 178.0 lb

## 2023-12-24 DIAGNOSIS — E119 Type 2 diabetes mellitus without complications: Secondary | ICD-10-CM | POA: Diagnosis not present

## 2023-12-24 DIAGNOSIS — Z01818 Encounter for other preprocedural examination: Secondary | ICD-10-CM | POA: Insufficient documentation

## 2023-12-24 DIAGNOSIS — I10 Essential (primary) hypertension: Secondary | ICD-10-CM | POA: Diagnosis not present

## 2023-12-24 LAB — CBC
HCT: 44 % (ref 36.0–46.0)
Hemoglobin: 14 g/dL (ref 12.0–15.0)
MCH: 27.7 pg (ref 26.0–34.0)
MCHC: 31.8 g/dL (ref 30.0–36.0)
MCV: 87 fL (ref 80.0–100.0)
Platelets: 256 K/uL (ref 150–400)
RBC: 5.06 MIL/uL (ref 3.87–5.11)
RDW: 13.7 % (ref 11.5–15.5)
WBC: 8.6 K/uL (ref 4.0–10.5)
nRBC: 0 % (ref 0.0–0.2)

## 2023-12-24 LAB — BASIC METABOLIC PANEL WITH GFR
Anion gap: 14 (ref 5–15)
BUN: 21 mg/dL (ref 8–23)
CO2: 23 mmol/L (ref 22–32)
Calcium: 9.8 mg/dL (ref 8.9–10.3)
Chloride: 103 mmol/L (ref 98–111)
Creatinine, Ser: 0.81 mg/dL (ref 0.44–1.00)
GFR, Estimated: >60 mL/min (ref >60)
Glucose, Bld: 99 mg/dL (ref 70–99)
Potassium: 3.7 mmol/L (ref 3.5–5.1)
Sodium: 140 mmol/L (ref 135–145)

## 2023-12-24 LAB — SURGICAL PCR SCREEN
MRSA, PCR: NEGATIVE
Staphylococcus aureus: NEGATIVE

## 2023-12-24 LAB — HEMOGLOBIN A1C
Hgb A1c MFr Bld: 6 % — ABNORMAL HIGH (ref 4.8–5.6)
Mean Plasma Glucose: 125.5 mg/dL

## 2023-12-24 LAB — GLUCOSE, CAPILLARY: Glucose-Capillary: 99 mg/dL (ref 70–99)

## 2023-12-24 NOTE — Progress Notes (Addendum)
 For Anesthesia: PCP - Wonda Worth SQUIBB, PA  Cardiologist - N/A  Bowel Prep reminder:  Chest x-ray -  EKG - 12/24/23 Stress Test -  ECHO -  Cardiac Cath -  Pacemaker/ICD device last checked: Pacemaker orders received: Device Rep notified:  Spinal Cord Stimulator:N/A  Sleep Study - N/A CPAP -   Fasting Blood Sugar - 100's Checks Blood Sugar __2___ times a week Date and result of last Hgb A1c-  Last dose of GLP1 agonist- ozempic. Last dose: 12/08/23 GLP1 instructions:   Last dose of SGLT-2 inhibitors- jardiance SGLT-2 instructions: To hold it after: 12/29/23  Blood Thinner Instructions:N/A Aspirin  Instructions: Last Dose:  Activity level: Can go up a flight of stairs and activities of daily living without stopping and without chest pain and/or shortness of breath   Able to exercise without chest pain and/or shortness of breath  Anesthesia review: Hx: HTN,DIA  Patient denies shortness of breath, fever, cough and chest pain at PAT appointment   Patient verbalized understanding of instructions that were reviewed over the telephone.

## 2024-01-01 NOTE — H&P (Signed)
 TOTAL KNEE REVISION ADMISSION H&P  Patient is being admitted for left revision total knee arthroplasty.  Therapy Plans: outpatient therapy at [facility] [place] Disposition: Home with daughter (lives together) Planned DVT Prophylaxis: aspirin  81mg  BID DME needed: none PCP: Clearance received TXA: IV Allergies: PCN - hives Anesthesia Concerns: none BMI: 30 Last HgbA1c: 6.4%   Other: - Pre op motion is limited to 0-40 ish >> developed slowly over time - Didn't take pain meds after first knees - oxycodone , robaxin , tylenol , celebrex  - no hx of VTE - hx of endometrial CA, s/p hysterectomy in 2021 - staying overnight - Will order a CPM **  Subjective:  Chief Complaint:left knee pain.  HPI: Tracey Foster, 68 y.o. female, has a history of left total knee arthroplasty in 2014 by Dr. Ernie. 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 At this point, she is ready to proceed with total knee revision. Dr. Ernie has discussed with her risks, benefits, and expectations. She is in agreement.  Patient Active Problem List   Diagnosis Date Noted   Endometrial cancer (HCC) 09/02/2019   S/P left TKA 12/09/2012   Expected blood loss anemia 11/12/2012   Morbid obesity (HCC) 11/12/2012   Past Medical History:  Diagnosis Date   Arthritis    Diabetes mellitus without complication (HCC)    type 2   Endometrial cancer (HCC)    High cholesterol    Hypertension    Obesity (BMI 30.0-34.9)     Past Surgical History:  Procedure Laterality Date   CATARACT EXTRACTION, BILATERAL  2024   COLONOSCOPY     ROBOTIC ASSISTED TOTAL HYSTERECTOMY WITH BILATERAL SALPINGO OOPHERECTOMY Bilateral 09/11/2019   Procedure: XI ROBOTIC ASSISTED TOTAL HYSTERECTOMY WITH BILATERAL SALPINGO OOPHORECTOMY;  Surgeon: Viktoria Comer SAUNDERS, MD;  Location: [facility];  Service: Gynecology;  Laterality: Bilateral;   SENTINEL NODE BIOPSY N/A 09/11/2019   Procedure: SENTINEL NODE BIOPSY;  Surgeon: Viktoria Comer SAUNDERS, MD;  Location: [facility];  Service: Gynecology;  Laterality: N/A;   TOTAL KNEE ARTHROPLASTY Right 11/11/2012   Procedure: RIGHT TOTAL KNEE ARTHROPLASTY;  Surgeon: Donnice JONETTA Ernie, MD;  Location: [facility];  Service: Orthopedics;  Laterality: Right;   TOTAL KNEE ARTHROPLASTY Left 12/09/2012   Procedure: LEFT TOTAL KNEE ARTHROPLASTY;  Surgeon: Donnice JONETTA Ernie, MD;  Location: [facility];  Service: Orthopedics;  Laterality: Left;   WISDOM TOOTH EXTRACTION      No current facility-administered medications for this encounter.   Current Outpatient Medications  Medication Sig Dispense Refill Last Dose/Taking   Cholecalciferol (VITAMIN D3) 50 MCG (2000 UT) TABS Take 2,000 Units by mouth in the morning.   Taking   clindamycin  (CLEOCIN ) 300 MG capsule Take 900 mg by mouth See admin instructions. TAKE 3 CAPSULES (900 MG) BY MOUTH 1 HOUR PRIOR TO DENTAL APPOINTMENTS   Taking   ezetimibe -simvastatin  (VYTORIN ) 10-40 MG per tablet Take 1 tablet by mouth at bedtime.   Taking   ferrous sulfate  324 MG TBEC Take 324 mg by mouth every Monday, Wednesday, and Friday. In the morning.   Taking   ibuprofen  (ADVIL ) 200 MG tablet Take 200 mg by mouth every 8 (eight) hours as needed (pain.).   Taking As Needed   JARDIANCE  25 MG TABS tablet Take 25 mg by mouth in the morning.   Taking   lisinopril -hydrochlorothiazide  (ZESTORETIC ) 10-12.5 MG tablet Take 1 tablet by mouth every evening.   Taking   metFORMIN  (GLUCOPHAGE ) 1000 MG tablet Take 1,000 mg by mouth 2 (  two) times daily with a meal. Lunch and supper   Taking   Multiple Vitamins-Minerals (VITEYES AREDS FORMULA PO) Take 1 capsule by mouth in the morning and at bedtime.   Taking   nystatin  (MYCOSTATIN /NYSTOP ) powder Apply 1 Application topically 3 (three) times daily. (Patient taking differently: Apply 1 Application topically 3 (three) times daily as needed (skin fold rash).) 15 g 1 Taking Differently   OZEMPIC, 0.25 OR 0.5 MG/DOSE, 2 MG/3ML SOPN Inject 0.5 mg into the  skin every Sunday.   Taking   Allergies  Allergen Reactions   Penicillins Hives    Did it involve swelling of the face/tongue/throat, SOB, or low BP? No Did it involve sudden or severe rash/hives, skin peeling, or any reaction on the inside of your mouth or nose? Yes Did you need to seek medical attention at a hospital or doctor's office? No When did it last happen? ~15 years ago       If all above answers are "NO", may proceed with cephalosporin use.     Social History   Tobacco Use   Smoking status: Never   Smokeless tobacco: Never  Substance Use Topics   Alcohol use: No    Family History  Problem Relation Age of Onset   Hypertension Mother    Hyperlipidemia Mother    Diabetes Father    Hypertension Father    Hypertension Paternal Grandmother    Diabetes Paternal Grandmother    Heart attack Paternal Grandmother    Hypertension Paternal Grandfather    Colon cancer Neg Hx    Uterine cancer Neg Hx    Breast cancer Neg Hx    Ovarian cancer Neg Hx       Review of Systems  Constitutional:  Negative for chills and fever.  Respiratory:  Negative for cough and shortness of breath.   Cardiovascular:  Negative for chest pain.  Gastrointestinal:  Negative for nausea and vomiting.  Musculoskeletal:  Positive for arthralgias.      Objective:  Physical Exam Well nourished and well developed. General: Alert and oriented x3, cooperative and pleasant, no acute distress.  Musculoskeletal: Right knee exam: Well-healed surgical incision Full knee extension and flexion to about 110 degrees No significant tenderness Stable medial and lateral collateral ligaments   Left knee exam: Well-healed surgical incision without erythema warmth or palpable effusion Significant decreased range of motion lacking at least 10 degrees of extension and flexing only to about 40 degrees with tightness and pain  Vital signs in last 24 hours:    Labs:  Estimated body mass index is 30.55  kg/m as calculated from the following:   Height as of 12/24/23: 5' 4 (1.626 m).   Weight as of 12/24/23: 80.7 kg.  Imaging Review  Imaging: Standing AP and lateral both knees were ordered and reviewed. Both radiographs reveal stable well-fixed components without evidence of obvious complication or concern. Component sizing appears to be appropriate on both knees.   Assessment/Plan:  End stage arthritis, left knee(s) with failed previous arthroplasty.   The patient history, physical examination, clinical judgment of the provider and imaging studies are consistent with end stage degenerative joint disease of the left knee(s), previous total knee arthroplasty. Revision total knee arthroplasty is deemed medically necessary. The treatment options including medical management, injection therapy, arthroscopy and revision arthroplasty were discussed at length. The risks and benefits of revision total knee arthroplasty were presented and reviewed. The risks due to aseptic loosening, infection, stiffness, patella tracking problems, thromboembolic complications and other  imponderables were discussed. The patient acknowledged the explanation, agreed to proceed with the plan and consent was signed. Patient is being admitted for inpatient treatment for surgery, pain control, PT, OT, prophylactic antibiotics, VTE prophylaxis, progressive ambulation and ADL's and discharge planning.The patient is planning to be discharged home.  Rosina Calin, PA-C Orthopedic Surgery [facility] [place] [phone]

## 2024-01-01 NOTE — Anesthesia Preprocedure Evaluation (Signed)
 Anesthesia Evaluation  Patient identified by MRN, date of birth, ID band Patient awake    Reviewed: Allergy & Precautions, NPO status , Patient's Chart, lab work & pertinent test results  History of Anesthesia Complications Negative for: history of anesthetic complications  Airway Mallampati: II  TM Distance: >3 FB Neck ROM: Full    Dental  (+) Dental Advisory Given, Missing   Pulmonary neg pulmonary ROS   breath sounds clear to auscultation       Cardiovascular hypertension, Pt. on medications (-) angina  Rhythm:Regular Rate:Normal     Neuro/Psych negative neurological ROS  negative psych ROS   GI/Hepatic negative GI ROS, Neg liver ROS,,,  Endo/Other  diabetes (glu 157), Oral Hypoglycemic Agents  Ozempic: 3 weeks ago BMI 30.5  Renal/GU negative Renal ROS     Musculoskeletal  (+) Arthritis ,    Abdominal   Peds  Hematology Hb 14.0, plt 253k   Anesthesia Other Findings   Reproductive/Obstetrics                              Anesthesia Physical Anesthesia Plan  ASA: 3  Anesthesia Plan: Spinal   Post-op Pain Management: Regional block* and Tylenol  PO (pre-op)*   Induction:   PONV Risk Score and Plan: 2 and Treatment may vary due to age or medical condition  Airway Management Planned: Natural Airway and Simple Face Mask  Additional Equipment: None  Intra-op Plan:   Post-operative Plan:   Informed Consent: I have reviewed the patients History and Physical, chart, labs and discussed the procedure including the risks, benefits and alternatives for the proposed anesthesia with the patient or authorized representative who has indicated his/her understanding and acceptance.     Dental advisory given  Plan Discussed with: CRNA and Surgeon  Anesthesia Plan Comments: (Plan routine monitors, SAB with adductor canal block for post op analgesia)         Anesthesia Quick  Evaluation

## 2024-01-02 ENCOUNTER — Inpatient Hospital Stay (HOSPITAL_COMMUNITY): Payer: Self-pay | Admitting: Anesthesiology

## 2024-01-02 ENCOUNTER — Encounter (HOSPITAL_COMMUNITY)
Admission: RE | Disposition: A | Discharge status: Home / Self Care | Payer: Self-pay | Source: Home / Self Care | Attending: Orthopedic Surgery

## 2024-01-02 ENCOUNTER — Other Ambulatory Visit: Payer: Self-pay

## 2024-01-02 ENCOUNTER — Inpatient Hospital Stay (HOSPITAL_COMMUNITY)
Admission: RE | Admit: 2024-01-02 | Discharge: 2024-01-03 | DRG: 468 | Disposition: A | Discharge status: Home / Self Care | Attending: Orthopedic Surgery | Admitting: Orthopedic Surgery

## 2024-01-02 ENCOUNTER — Encounter (HOSPITAL_COMMUNITY): Payer: Self-pay | Admitting: Anesthesiology

## 2024-01-02 ENCOUNTER — Encounter (HOSPITAL_COMMUNITY): Payer: Self-pay | Admitting: Orthopedic Surgery

## 2024-01-02 DIAGNOSIS — Z8542 Personal history of malignant neoplasm of other parts of uterus: Secondary | ICD-10-CM

## 2024-01-02 DIAGNOSIS — I10 Essential (primary) hypertension: Secondary | ICD-10-CM | POA: Diagnosis present

## 2024-01-02 DIAGNOSIS — Z8249 Family history of ischemic heart disease and other diseases of the circulatory system: Secondary | ICD-10-CM | POA: Diagnosis not present

## 2024-01-02 DIAGNOSIS — Z96652 Presence of left artificial knee joint: Principal | ICD-10-CM

## 2024-01-02 DIAGNOSIS — E119 Type 2 diabetes mellitus without complications: Secondary | ICD-10-CM | POA: Diagnosis present

## 2024-01-02 DIAGNOSIS — E7800 Pure hypercholesterolemia, unspecified: Secondary | ICD-10-CM | POA: Diagnosis present

## 2024-01-02 DIAGNOSIS — Z6830 Body mass index (BMI) 30.0-30.9, adult: Secondary | ICD-10-CM

## 2024-01-02 DIAGNOSIS — Z9841 Cataract extraction status, right eye: Secondary | ICD-10-CM | POA: Diagnosis not present

## 2024-01-02 DIAGNOSIS — Z7985 Long-term (current) use of injectable non-insulin antidiabetic drugs: Secondary | ICD-10-CM

## 2024-01-02 DIAGNOSIS — Z7984 Long term (current) use of oral hypoglycemic drugs: Secondary | ICD-10-CM

## 2024-01-02 DIAGNOSIS — Z83438 Family history of other disorder of lipoprotein metabolism and other lipidemia: Secondary | ICD-10-CM | POA: Diagnosis not present

## 2024-01-02 DIAGNOSIS — Z880 Allergy status to penicillin: Secondary | ICD-10-CM | POA: Diagnosis not present

## 2024-01-02 DIAGNOSIS — Z90710 Acquired absence of both cervix and uterus: Secondary | ICD-10-CM | POA: Diagnosis not present

## 2024-01-02 DIAGNOSIS — T84093A Other mechanical complication of internal left knee prosthesis, initial encounter: Secondary | ICD-10-CM

## 2024-01-02 DIAGNOSIS — E669 Obesity, unspecified: Secondary | ICD-10-CM | POA: Diagnosis present

## 2024-01-02 DIAGNOSIS — Z833 Family history of diabetes mellitus: Secondary | ICD-10-CM | POA: Diagnosis not present

## 2024-01-02 DIAGNOSIS — Z9842 Cataract extraction status, left eye: Secondary | ICD-10-CM

## 2024-01-02 DIAGNOSIS — Z79899 Other long term (current) drug therapy: Secondary | ICD-10-CM

## 2024-01-02 DIAGNOSIS — M1712 Unilateral primary osteoarthritis, left knee: Secondary | ICD-10-CM | POA: Diagnosis present

## 2024-01-02 DIAGNOSIS — T8482XA Fibrosis due to internal orthopedic prosthetic devices, implants and grafts, initial encounter: Secondary | ICD-10-CM | POA: Diagnosis present

## 2024-01-02 DIAGNOSIS — Y792 Prosthetic and other implants, materials and accessory orthopedic devices associated with adverse incidents: Secondary | ICD-10-CM | POA: Diagnosis present

## 2024-01-02 HISTORY — PX: TOTAL KNEE REVISION: SHX996

## 2024-01-02 LAB — GLUCOSE, CAPILLARY
Glucose-Capillary: 157 mg/dL — ABNORMAL HIGH (ref 70–99)
Glucose-Capillary: 155 mg/dL — ABNORMAL HIGH (ref 70–99)
Glucose-Capillary: 346 mg/dL — ABNORMAL HIGH (ref 70–99)
Glucose-Capillary: 301 mg/dL — ABNORMAL HIGH (ref 70–99)

## 2024-01-02 SURGERY — TOTAL KNEE REVISION
Anesthesia: Spinal | Site: Knee | Laterality: Left

## 2024-01-02 MED ORDER — OXYCODONE HCL 5 MG PO TABS: 10.0000 mg | ORAL_TABLET | ORAL | Status: DC | PRN

## 2024-01-02 MED ORDER — MIDAZOLAM HCL 2 MG/2ML IJ SOLN
INTRAMUSCULAR | Status: DC | PRN
  Administered 2024-01-02: 1 mg via INTRAVENOUS

## 2024-01-02 MED ORDER — EZETIMIBE-SIMVASTATIN 10-40 MG PO TABS: 1.0000 | ORAL_TABLET | Freq: Every day | ORAL | Status: DC

## 2024-01-02 MED ORDER — POLYETHYLENE GLYCOL 3350 17 G PO PACK
17.0000 g | PACK | Freq: Two times a day (BID) | ORAL | Status: DC
  Administered 2024-01-02 – 2024-01-03 (×2): 17 g via ORAL
  Filled 2024-01-02 (×2): qty 1

## 2024-01-02 MED ORDER — SODIUM CHLORIDE 0.9 % IV SOLN: INTRAVENOUS | Status: DC

## 2024-01-02 MED ORDER — MIDAZOLAM HCL 2 MG/2ML IJ SOLN: 0.5000 mg | Freq: Once | INTRAMUSCULAR | Status: DC | PRN

## 2024-01-02 MED ORDER — ONDANSETRON HCL 4 MG/2ML IJ SOLN
INTRAMUSCULAR | Status: DC | PRN
  Administered 2024-01-02: 4 mg via INTRAVENOUS

## 2024-01-02 MED ORDER — BISACODYL 10 MG RE SUPP: 10.0000 mg | Freq: Every day | RECTAL | Status: DC | PRN

## 2024-01-02 MED ORDER — INSULIN ASPART 100 UNIT/ML IJ SOLN
0.0000 [IU] | Freq: Three times a day (TID) | INTRAMUSCULAR | Status: DC
  Administered 2024-01-02: 11 [IU] via SUBCUTANEOUS
  Administered 2024-01-03: 3 [IU] via SUBCUTANEOUS

## 2024-01-02 MED ORDER — ROPIVACAINE HCL 7.5 MG/ML IJ SOLN
INTRAMUSCULAR | Status: DC | PRN
Start: 2024-01-02 — End: 2024-01-02
  Administered 2024-01-02: 20 mL via PERINEURAL

## 2024-01-02 MED ORDER — HYDROMORPHONE HCL 1 MG/ML IJ SOLN: 0.2500 mg | INTRAMUSCULAR | Status: DC | PRN

## 2024-01-02 MED ORDER — ONDANSETRON HCL 4 MG PO TABS: 4.0000 mg | ORAL_TABLET | Freq: Four times a day (QID) | ORAL | Status: DC | PRN

## 2024-01-02 MED ORDER — SIMVASTATIN 40 MG PO TABS
40.0000 mg | ORAL_TABLET | Freq: Every day | ORAL | Status: DC
  Administered 2024-01-02: 40 mg via ORAL
  Filled 2024-01-02: qty 1

## 2024-01-02 MED ORDER — TRANEXAMIC ACID-NACL 1000-0.7 MG/100ML-% IV SOLN
1000.0000 mg | Freq: Once | INTRAVENOUS | Status: AC
  Administered 2024-01-02: 1000 mg via INTRAVENOUS
  Filled 2024-01-02: qty 100

## 2024-01-02 MED ORDER — FENTANYL CITRATE (PF) 100 MCG/2ML IJ SOLN
INTRAMUSCULAR | Status: AC
  Filled 2024-01-02: qty 2

## 2024-01-02 MED ORDER — STERILE WATER FOR IRRIGATION IR SOLN
Status: DC | PRN
  Administered 2024-01-02: 2000 mL

## 2024-01-02 MED ORDER — EZETIMIBE 10 MG PO TABS
10.0000 mg | ORAL_TABLET | Freq: Every day | ORAL | Status: DC
  Administered 2024-01-02: 10 mg via ORAL
  Filled 2024-01-02: qty 1

## 2024-01-02 MED ORDER — PHENYLEPHRINE HCL-NACL 20-0.9 MG/250ML-% IV SOLN
INTRAVENOUS | Status: DC | PRN
  Administered 2024-01-02: 50 ug/min via INTRAVENOUS

## 2024-01-02 MED ORDER — LIDOCAINE HCL (PF) 2 % IJ SOLN
INTRAMUSCULAR | Status: AC
  Filled 2024-01-02: qty 5

## 2024-01-02 MED ORDER — PROPOFOL 1000 MG/100ML IV EMUL
INTRAVENOUS | Status: AC
  Filled 2024-01-02: qty 100

## 2024-01-02 MED ORDER — BUPIVACAINE-EPINEPHRINE (PF) 0.25% -1:200000 IJ SOLN
INTRAMUSCULAR | Status: AC
  Filled 2024-01-02: qty 30

## 2024-01-02 MED ORDER — DIPHENHYDRAMINE HCL 12.5 MG/5ML PO ELIX: 12.5000 mg | ORAL_SOLUTION | ORAL | Status: DC | PRN

## 2024-01-02 MED ORDER — LACTATED RINGERS IV SOLN: INTRAVENOUS | Status: DC

## 2024-01-02 MED ORDER — PHENOL 1.4 % MT LIQD: 1.0000 | OROMUCOSAL | Status: DC | PRN

## 2024-01-02 MED ORDER — ONDANSETRON HCL 4 MG/2ML IJ SOLN
INTRAMUSCULAR | Status: AC
  Filled 2024-01-02: qty 2

## 2024-01-02 MED ORDER — SODIUM CHLORIDE (PF) 0.9 % IJ SOLN
INTRAMUSCULAR | Status: AC
  Filled 2024-01-02: qty 30

## 2024-01-02 MED ORDER — 0.9 % SODIUM CHLORIDE (POUR BTL) OPTIME
TOPICAL | Status: DC | PRN
Start: 2024-01-02 — End: 2024-01-02
  Administered 2024-01-02: 1000 mL

## 2024-01-02 MED ORDER — FERROUS SULFATE 325 (65 FE) MG PO TABS
325.0000 mg | ORAL_TABLET | ORAL | Status: DC
  Administered 2024-01-03: 325 mg via ORAL
  Filled 2024-01-02: qty 1

## 2024-01-02 MED ORDER — ACETAMINOPHEN 500 MG PO TABS
1000.0000 mg | ORAL_TABLET | Freq: Once | ORAL | Status: AC
  Administered 2024-01-02: 1000 mg via ORAL
  Filled 2024-01-02: qty 2

## 2024-01-02 MED ORDER — OXYCODONE HCL 5 MG/5ML PO SOLN: 5.0000 mg | Freq: Once | ORAL | Status: DC | PRN

## 2024-01-02 MED ORDER — METHOCARBAMOL 1000 MG/10ML IJ SOLN
500.0000 mg | Freq: Four times a day (QID) | INTRAMUSCULAR | Status: DC | PRN
Start: 2024-01-02 — End: 2024-01-03

## 2024-01-02 MED ORDER — CEFAZOLIN SODIUM-DEXTROSE 2-4 GM/100ML-% IV SOLN
2.0000 g | Freq: Four times a day (QID) | INTRAVENOUS | Status: AC
  Administered 2024-01-02 (×2): 2 g via INTRAVENOUS
  Filled 2024-01-02 (×2): qty 100

## 2024-01-02 MED ORDER — ONDANSETRON HCL 4 MG/2ML IJ SOLN: 4.0000 mg | Freq: Four times a day (QID) | INTRAMUSCULAR | Status: DC | PRN

## 2024-01-02 MED ORDER — MIDAZOLAM HCL 2 MG/2ML IJ SOLN
INTRAMUSCULAR | Status: AC
Start: 2024-01-02 — End: 2024-01-02
  Filled 2024-01-02: qty 2

## 2024-01-02 MED ORDER — DEXAMETHASONE SODIUM PHOSPHATE 10 MG/ML IJ SOLN: 8.0000 mg | Freq: Once | INTRAMUSCULAR | Status: DC

## 2024-01-02 MED ORDER — KETOROLAC TROMETHAMINE 30 MG/ML IJ SOLN
INTRAMUSCULAR | Status: AC
  Filled 2024-01-02: qty 1

## 2024-01-02 MED ORDER — CEFAZOLIN SODIUM-DEXTROSE 2-4 GM/100ML-% IV SOLN
2.0000 g | INTRAVENOUS | Status: AC
Start: 2024-01-02 — End: 2024-01-02
  Administered 2024-01-02: 2 g via INTRAVENOUS
  Filled 2024-01-02: qty 100

## 2024-01-02 MED ORDER — DEXAMETHASONE SODIUM PHOSPHATE 10 MG/ML IJ SOLN
INTRAMUSCULAR | Status: DC | PRN
  Administered 2024-01-02: 10 mg via INTRAVENOUS

## 2024-01-02 MED ORDER — METFORMIN HCL 500 MG PO TABS
1000.0000 mg | ORAL_TABLET | Freq: Two times a day (BID) | ORAL | Status: DC
  Administered 2024-01-03: 1000 mg via ORAL
  Filled 2024-01-02: qty 2

## 2024-01-02 MED ORDER — BUPIVACAINE IN DEXTROSE 0.75-8.25 % IT SOLN
INTRATHECAL | Status: DC | PRN
Start: 2024-01-02 — End: 2024-01-02
  Administered 2024-01-02: 12 mg via INTRATHECAL

## 2024-01-02 MED ORDER — PROPOFOL 10 MG/ML IV BOLUS
INTRAVENOUS | Status: AC
  Filled 2024-01-02: qty 20

## 2024-01-02 MED ORDER — PROPOFOL 10 MG/ML IV BOLUS
INTRAVENOUS | Status: DC | PRN
  Administered 2024-01-02: 20 mg via INTRAVENOUS
  Administered 2024-01-02: 30 mg via INTRAVENOUS
  Administered 2024-01-02 (×3): 20 mg via INTRAVENOUS

## 2024-01-02 MED ORDER — METOCLOPRAMIDE HCL 5 MG PO TABS: 5.0000 mg | ORAL_TABLET | Freq: Three times a day (TID) | ORAL | Status: DC | PRN

## 2024-01-02 MED ORDER — CHLORHEXIDINE GLUCONATE 0.12 % MT SOLN
15.0000 mL | Freq: Once | OROMUCOSAL | Status: AC
  Administered 2024-01-02: 15 mL via OROMUCOSAL

## 2024-01-02 MED ORDER — METHOCARBAMOL 500 MG PO TABS: 500.0000 mg | ORAL_TABLET | Freq: Four times a day (QID) | ORAL | Status: DC | PRN

## 2024-01-02 MED ORDER — HYDROCHLOROTHIAZIDE 12.5 MG PO TABS: 12.5000 mg | ORAL_TABLET | Freq: Every evening | ORAL | Status: DC

## 2024-01-02 MED ORDER — DEXAMETHASONE SODIUM PHOSPHATE 10 MG/ML IJ SOLN
INTRAMUSCULAR | Status: AC
  Filled 2024-01-02: qty 1

## 2024-01-02 MED ORDER — POVIDONE-IODINE 10 % EX SWAB: 2.0000 | Freq: Once | CUTANEOUS | Status: DC

## 2024-01-02 MED ORDER — PROPOFOL 500 MG/50ML IV EMUL
INTRAVENOUS | Status: DC | PRN
  Administered 2024-01-02: 50 ug/kg/min via INTRAVENOUS

## 2024-01-02 MED ORDER — MEPERIDINE HCL 25 MG/ML IJ SOLN
6.2500 mg | INTRAMUSCULAR | Status: DC | PRN
  Filled 2024-01-02: qty 1

## 2024-01-02 MED ORDER — ORAL CARE MOUTH RINSE: 15.0000 mL | Freq: Once | OROMUCOSAL | Status: AC

## 2024-01-02 MED ORDER — EMPAGLIFLOZIN 25 MG PO TABS
25.0000 mg | ORAL_TABLET | Freq: Every morning | ORAL | Status: DC
  Administered 2024-01-03: 25 mg via ORAL
  Filled 2024-01-02: qty 1

## 2024-01-02 MED ORDER — LISINOPRIL 10 MG PO TABS: 10.0000 mg | ORAL_TABLET | Freq: Every evening | ORAL | Status: DC

## 2024-01-02 MED ORDER — FENTANYL CITRATE (PF) 100 MCG/2ML IJ SOLN
INTRAMUSCULAR | Status: DC | PRN
  Administered 2024-01-02: 50 ug via INTRAVENOUS

## 2024-01-02 MED ORDER — OXYCODONE HCL 5 MG PO TABS: 5.0000 mg | ORAL_TABLET | ORAL | Status: DC | PRN

## 2024-01-02 MED ORDER — ALUM & MAG HYDROXIDE-SIMETH 200-200-20 MG/5ML PO SUSP: 30.0000 mL | ORAL | Status: DC | PRN

## 2024-01-02 MED ORDER — SODIUM CHLORIDE 0.9 % IR SOLN
Status: DC | PRN
  Administered 2024-01-02: 3000 mL

## 2024-01-02 MED ORDER — TRANEXAMIC ACID-NACL 1000-0.7 MG/100ML-% IV SOLN
1000.0000 mg | INTRAVENOUS | Status: AC
  Administered 2024-01-02: 1000 mg via INTRAVENOUS
  Filled 2024-01-02: qty 100

## 2024-01-02 MED ORDER — ACETAMINOPHEN 500 MG PO TABS
1000.0000 mg | ORAL_TABLET | Freq: Four times a day (QID) | ORAL | Status: DC
  Administered 2024-01-02 – 2024-01-03 (×4): 1000 mg via ORAL
  Filled 2024-01-02 (×4): qty 2

## 2024-01-02 MED ORDER — ASPIRIN 81 MG PO CHEW
81.0000 mg | CHEWABLE_TABLET | Freq: Two times a day (BID) | ORAL | Status: DC
  Administered 2024-01-02 – 2024-01-03 (×2): 81 mg via ORAL
  Filled 2024-01-02 (×2): qty 1

## 2024-01-02 MED ORDER — METOCLOPRAMIDE HCL 5 MG/ML IJ SOLN: 5.0000 mg | Freq: Three times a day (TID) | INTRAMUSCULAR | Status: DC | PRN

## 2024-01-02 MED ORDER — HYDROMORPHONE HCL 1 MG/ML IJ SOLN: 0.5000 mg | INTRAMUSCULAR | Status: DC | PRN

## 2024-01-02 MED ORDER — SENNA 8.6 MG PO TABS
2.0000 | ORAL_TABLET | Freq: Every day | ORAL | Status: DC
  Administered 2024-01-02: 17.2 mg via ORAL
  Filled 2024-01-02: qty 2

## 2024-01-02 MED ORDER — KETOROLAC TROMETHAMINE 15 MG/ML IJ SOLN
7.5000 mg | Freq: Four times a day (QID) | INTRAMUSCULAR | Status: DC
  Administered 2024-01-02 – 2024-01-03 (×3): 7.5 mg via INTRAVENOUS
  Filled 2024-01-02 (×4): qty 1

## 2024-01-02 MED ORDER — DEXAMETHASONE SODIUM PHOSPHATE 10 MG/ML IJ SOLN
10.0000 mg | Freq: Once | INTRAMUSCULAR | Status: AC
Start: 2024-01-03 — End: 2024-01-03
  Administered 2024-01-03: 10 mg via INTRAVENOUS
  Filled 2024-01-02: qty 1

## 2024-01-02 MED ORDER — LISINOPRIL-HYDROCHLOROTHIAZIDE 10-12.5 MG PO TABS: 1.0000 | ORAL_TABLET | Freq: Every evening | ORAL | Status: DC

## 2024-01-02 MED ORDER — SODIUM CHLORIDE (PF) 0.9 % IJ SOLN
INTRAMUSCULAR | Status: DC | PRN
  Administered 2024-01-02: 61 mL

## 2024-01-02 MED ORDER — MENTHOL 3 MG MT LOZG: 1.0000 | LOZENGE | OROMUCOSAL | Status: DC | PRN

## 2024-01-02 MED ORDER — OXYCODONE HCL 5 MG PO TABS: 5.0000 mg | ORAL_TABLET | Freq: Once | ORAL | Status: DC | PRN

## 2024-01-02 SURGICAL SUPPLY — 57 items
AUGMENT POST FEM SZ4 4 (Miscellaneous) IMPLANT
BAG COUNTER SPONGE SURGICOUNT (BAG) IMPLANT
BAG DECANTER FOR FLEXI CONT (MISCELLANEOUS) IMPLANT
BAG ZIPLOCK 12X15 (MISCELLANEOUS) IMPLANT
BLADE SAW SGTL 11.0X1.19X90.0M (BLADE) IMPLANT
BLADE SAW SGTL 13.0X1.19X90.0M (BLADE) ×1 IMPLANT
BLADE SAW SGTL 81X20 HD (BLADE) ×1 IMPLANT
BNDG ELASTIC 6INX 5YD STR LF (GAUZE/BANDAGES/DRESSINGS) ×1 IMPLANT
BOWL SMART MIX CTS (DISPOSABLE) IMPLANT
BRUSH FEMORAL CANAL (MISCELLANEOUS) IMPLANT
CEMENT HV SMART SET (Cement) IMPLANT
CEMENT RESTRICTOR DEPUY SZ 4 (Cement) IMPLANT
COMPONENT FEM ATN CR SZ4 LT (Femur) IMPLANT
COVER SURGICAL LIGHT HANDLE (MISCELLANEOUS) ×1 IMPLANT
CUFF TRNQT CYL 34X4.125X (TOURNIQUET CUFF) ×1 IMPLANT
DERMABOND ADVANCED .7 DNX12 (GAUZE/BANDAGES/DRESSINGS) ×1 IMPLANT
DRAPE U-SHAPE 47X51 STRL (DRAPES) ×1 IMPLANT
DRSG AQUACEL AG ADV 3.5X14 (GAUZE/BANDAGES/DRESSINGS) IMPLANT
DURAPREP 26ML APPLICATOR (WOUND CARE) ×2 IMPLANT
ELECT REM PT RETURN 15FT ADLT (MISCELLANEOUS) ×1 IMPLANT
FACESHIELD WRAPAROUND OR TEAM (MASK) IMPLANT
GAUZE SPONGE 2X2 8PLY STRL LF (GAUZE/BANDAGES/DRESSINGS) IMPLANT
GLOVE BIO SURGEON STRL SZ 6 (GLOVE) ×1 IMPLANT
GLOVE BIOGEL PI IND STRL 6.5 (GLOVE) ×1 IMPLANT
GLOVE BIOGEL PI IND STRL 7.5 (GLOVE) ×1 IMPLANT
GLOVE ORTHO TXT STRL SZ7.5 (GLOVE) ×2 IMPLANT
GOWN STRL REUS W/ TWL LRG LVL3 (GOWN DISPOSABLE) ×2 IMPLANT
HOLDER FOLEY CATH W/STRAP (MISCELLANEOUS) IMPLANT
INSERT TIB CMT ATTUNE RP SZ4 (Knees) IMPLANT
INSERT TIB CRS ATTUNE SZ4 8 (Insert) IMPLANT
KIT TURNOVER KIT A (KITS) ×1 IMPLANT
MANIFOLD NEPTUNE II (INSTRUMENTS) ×1 IMPLANT
NDL SAFETY ECLIPSE 18X1.5 (NEEDLE) ×1 IMPLANT
NS IRRIG 1000ML POUR BTL (IV SOLUTION) ×1 IMPLANT
PACK TOTAL KNEE CUSTOM (KITS) ×1 IMPLANT
PENCIL SMOKE EVACUATOR (MISCELLANEOUS) ×1 IMPLANT
PIN STEINMAN FIXATION KNEE (PIN) IMPLANT
PROTECTOR NERVE ULNAR (MISCELLANEOUS) ×1 IMPLANT
RESTRICTOR CEMENT SZ 5 C-STEM (Cement) IMPLANT
SET HNDPC FAN SPRY TIP SCT (DISPOSABLE) ×1 IMPLANT
SET PAD KNEE POSITIONER (MISCELLANEOUS) ×1 IMPLANT
SLEEVE TIB ATTUNE FP 45 (Knees) IMPLANT
SOLUTION PRONTOSAN WOUND 350ML (IRRIGATION / IRRIGATOR) IMPLANT
SPIKE FLUID TRANSFER (MISCELLANEOUS) IMPLANT
STAPLER SKIN PROX 35W (STAPLE) IMPLANT
STEM CEMT ATTUNE 14X80 (Knees) IMPLANT
STEM REV CEMENTED 14X50MM (Stem) IMPLANT
SUT MNCRL AB 3-0 PS2 18 (SUTURE) ×1 IMPLANT
SUT STRATAFIX PDS+ 0 24IN (SUTURE) ×1 IMPLANT
SUT VIC AB 1 CT1 36 (SUTURE) ×1 IMPLANT
SUT VIC AB 2-0 CT1 TAPERPNT 27 (SUTURE) ×3 IMPLANT
SYR 3ML LL SCALE MARK (SYRINGE) ×1 IMPLANT
TOWER CARTRIDGE SMART MIX (DISPOSABLE) IMPLANT
TRAY FOLEY MTR SLVR 14FR STAT (SET/KITS/TRAYS/PACK) IMPLANT
TUBE SUCTION HIGH CAP CLEAR NV (SUCTIONS) ×1 IMPLANT
WATER STERILE IRR 1000ML POUR (IV SOLUTION) ×1 IMPLANT
WRAP KNEE MAXI GEL POST OP (GAUZE/BANDAGES/DRESSINGS) ×1 IMPLANT

## 2024-01-02 NOTE — Interval H&P Note (Signed)
 History and Physical Interval Note:  01/02/2024 7:12 AM  Tracey Foster  has presented today for surgery, with the diagnosis of Stiff left total knee arthroplasty.  The various methods of treatment have been discussed with the patient and family. After consideration of risks, benefits and other options for treatment, the patient has consented to  Procedure(s): TOTAL KNEE REVISION (Left) as a surgical intervention.  The patient's history has been reviewed, patient examined, no change in status, stable for surgery.  I have reviewed the patient's chart and labs.  Questions were answered to the patient's satisfaction.     [name]

## 2024-01-02 NOTE — Anesthesia Postprocedure Evaluation (Signed)
 Anesthesia Post Note  Patient: Tracey Foster  Procedure(s) Performed: TOTAL KNEE REVISION (Left: Knee)     Patient location during evaluation: PACU Anesthesia Type: Spinal Level of consciousness: awake and alert, oriented and patient cooperative Pain management: pain level controlled Vital Signs Assessment: post-procedure vital signs reviewed and stable Respiratory status: spontaneous breathing, nonlabored ventilation and respiratory function stable Cardiovascular status: blood pressure returned to baseline and stable Postop Assessment: no backache, spinal receding and no apparent nausea or vomiting Anesthetic complications: no   No notable events documented.  Last Vitals:  Vitals:   01/02/24 1115 01/02/24 1130  BP: 110/83 104/60  Pulse: 66 60  Resp: 10 15  Temp: 36.6 C   SpO2: 99% 99%    Last Pain:  Vitals:   01/02/24 1130  TempSrc:   PainSc: 0-No pain                 [name],E. Juandedios Dudash

## 2024-01-02 NOTE — Transfer of Care (Signed)
 Immediate Anesthesia Transfer of Care Note  Patient: Tracey Foster  Procedure(s) Performed: TOTAL KNEE REVISION (Left: Knee)  Patient Location: PACU  Anesthesia Type:Spinal  Level of Consciousness: awake, alert , and oriented  Airway & Oxygen Therapy: Patient Spontanous Breathing and Patient connected to face mask oxygen  Post-op Assessment: Report given to RN and Post -op Vital signs reviewed and stable  Post vital signs: Reviewed and unstable  Last Vitals:  Vitals Value Taken Time  BP 103/70 01/02/24 09:40  Temp    Pulse 74 01/02/24 09:43  Resp 18 01/02/24 09:43  SpO2 94 % 01/02/24 09:43  Vitals shown include unfiled device data.  Last Pain:  Vitals:   01/02/24 0551  TempSrc: Oral  PainSc:          Complications: No notable events documented.

## 2024-01-02 NOTE — Anesthesia Procedure Notes (Signed)
 Date/Time: 01/02/2024 7:27 AM  Performed by: Therisa Doyal CROME, CRNAOxygen Delivery Method: Simple face mask

## 2024-01-02 NOTE — Brief Op Note (Signed)
 01/02/2024  7:14 AM  PATIENT:  Tracey Foster  68 y.o. 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   Delay start of Pharmacological VTE agent (>24hrs) due to surgical blood loss or risk of bleeding: no

## 2024-01-02 NOTE — Anesthesia Procedure Notes (Signed)
 Spinal  Patient location during procedure: OR End time: 01/02/2024 7:37 AM Reason for block: surgical anesthesia Staffing Performed: anesthesiologist  Anesthesiologist: Leonce Athens, MD Resident/CRNA: Therisa Doyal CROME, CRNA Performed by: Leonce Athens, MD Authorized by: Leonce Athens, MD   Preanesthetic Checklist Completed: patient identified, IV checked, site marked, risks and benefits discussed, surgical consent, monitors and equipment checked, pre-op evaluation and timeout performed Spinal Block Patient position: sitting Prep: DuraPrep Patient monitoring: heart rate, cardiac monitor, continuous pulse ox and blood pressure Approach: midline Location: L3-4 Injection technique: single-shot Needle Needle type: Pencan and Introducer  Needle gauge: 24 G Needle length: 9 cm Assessment Sensory level: T4 Events: CSF return and second provider Additional Notes Pt identified in Operating room.  Monitors applied. Working IV access confirmed. Sterile prep, drape lumbar spine.  1% lido local L 3,4.  CRNA all attempts os, MD #[id number] Pencan into clear CSF L 3,4.  12mg  0.75% Bupivacaine  with dextrose  injected with asp CSF beginning and end of injection.  Patient asymptomatic, VSS, no heme aspirated, tolerated well.  Tracey Foster Leonce, MD

## 2024-01-02 NOTE — Evaluation (Signed)
 Physical Therapy Evaluation Patient Details Name: Tracey Foster MRN: [id number] DOB: [birth year] Today's Date: 01/02/2024  History of Present Illness  68 y.o. female admitted 01/02/24 for revision of L TKA. PMH: B TKA, HTN, DM, obesity, endometrial cancer.  Clinical Impression  Pt admitted with above diagnosis. Pt is mobilizing well, she ambulated 37' with RW, no loss of balance. Initiated TKA HEP. Good progress expected.  Pt currently with functional limitations due to the deficits listed below (see PT Problem List). Pt will benefit from acute skilled PT to increase their independence and safety with mobility to allow discharge.           If plan is discharge home, recommend the following: Assistance with cooking/housework;Assist for transportation;Help with stairs or ramp for entrance;A little help with bathing/dressing/bathroom;A little help with walking and/or transfers   Can travel by private vehicle        Equipment Recommendations None recommended by PT  Recommendations for Other Services       Functional Status Assessment Patient has had a recent decline in their functional status and demonstrates the ability to make significant improvements in function in a reasonable and predictable amount of time.     Precautions / Restrictions Precautions Precautions: Fall;Knee Recall of Precautions/Restrictions: Intact Precaution/Restrictions Comments: reviewed no pillow under knee Restrictions Weight Bearing Restrictions Per Provider Order: No Other Position/Activity Restrictions: WBAT LLE      Mobility  Bed Mobility Overal bed mobility: Modified Independent             General bed mobility comments: HOB up, used rail    Transfers Overall transfer level: Needs assistance Equipment used: Rolling walker (2 wheels) Transfers: Sit to/from Stand Sit to Stand: Min assist           General transfer comment: VCs hand placement, min A to power up/steady     Ambulation/Gait Ambulation/Gait assistance: Contact guard assist Gait Distance (Feet): 45 Feet Assistive device: Rolling walker (2 wheels) Gait Pattern/deviations: Step-to pattern, Decreased step length - right, Decreased step length - left Gait velocity: decr     General Gait Details: VCs sequencing and positioning in RW, no loss of balance nor buckling  Stairs            Wheelchair Mobility     Tilt Bed    Modified Rankin (Stroke Patients Only)       Balance Overall balance assessment: Modified Independent                                           Pertinent Vitals/Pain Pain Assessment Pain Assessment: 0-10 Pain Score: 0-No pain    Home Living Family/patient expects to be discharged to:: Private residence Living Arrangements: Children Available Help at Discharge: Family;Available 24 hours/day Type of Home: House Home Access: Stairs to enter   [employer] of Steps: 2   Home Layout: One level Home Equipment: Agricultural consultant (2 wheels);Cane - single point;Toilet riser      Prior Function Prior Level of Function : Independent/Modified Independent;Driving             Mobility Comments: no falls in past 6 months, walked without AD ADLs Comments: independent     Extremity/Trunk Assessment   Upper Extremity Assessment Upper Extremity Assessment: Overall WFL for tasks assessed    Lower Extremity Assessment Lower Extremity Assessment: LLE deficits/detail LLE Deficits / Details: SLR 3/5 LLE Sensation:  WNL LLE Coordination: WNL    Cervical / Trunk Assessment Cervical / Trunk Assessment: Normal  Communication   Communication Communication: No apparent difficulties    Cognition Arousal: Alert Behavior During Therapy: WFL for tasks assessed/performed   PT - Cognitive impairments: No apparent impairments                         Following commands: Intact       Cueing Cueing Techniques: Verbal cues      General Comments      Exercises Total Joint Exercises Ankle Circles/Pumps: AROM, Both, 10 reps, Supine Quad Sets: AROM, Both, 5 reps, Supine Heel Slides: AAROM, Left, 5 reps, Supine   Assessment/Plan    PT Assessment Patient needs continued PT services  PT Problem List Decreased activity tolerance;Decreased mobility       PT Treatment Interventions Gait training;Therapeutic exercise;Patient/family education    PT Goals (Current goals can be found in the Care Plan section)  Acute Rehab PT Goals Patient Stated Goal: walk without pain PT Goal Formulation: With patient Time For Goal Achievement: 01/09/24 Potential to Achieve Goals: Good    Frequency 7X/week     Co-evaluation               AM-PAC PT 6 Clicks Mobility  Outcome Measure Help needed turning from your back to your side while in a flat bed without using bedrails?: None Help needed moving from lying on your back to sitting on the side of a flat bed without using bedrails?: A Little Help needed moving to and from a bed to a chair (including a wheelchair)?: A Little Help needed standing up from a chair using your arms (e.g., wheelchair or bedside chair)?: A Little Help needed to walk in hospital room?: A Little Help needed climbing 3-5 steps with a railing? : A Little 6 Click Score: 19    End of Session Equipment Utilized During Treatment: Gait belt Activity Tolerance: Patient tolerated treatment well Patient left: in chair;with call bell/phone within reach;with chair alarm set Nurse Communication: Mobility status PT Visit Diagnosis: Difficulty in walking, not elsewhere classified (R26.2);Pain Pain - Right/Left: Left Pain - part of body: Knee    Time: 8650-8590 PT Time Calculation (min) (ACUTE ONLY): 20 min   Charges:   PT Evaluation $PT Eval Moderate Complexity: 1 Mod   PT General Charges $$ ACUTE PT VISIT: 1 Visit         Sylvan Delon Copp PT 01/02/2024  Acute Rehabilitation  Services  Office [phone]

## 2024-01-02 NOTE — Discharge Instructions (Signed)

## 2024-01-02 NOTE — Plan of Care (Signed)
  Problem: Activity: Goal: Risk for activity intolerance will decrease Outcome: Progressing   Problem: Safety: Goal: Ability to remain free from injury will improve Outcome: Progressing   Problem: Pain Managment: Goal: General experience of comfort will improve and/or be controlled Outcome: Progressing

## 2024-01-02 NOTE — Op Note (Signed)
 Tracey Foster, Tracey Foster MEDICAL RECORD NO: [id number] ACCOUNT NO: [id number] DATE OF BIRTH: 1956-02-27 FACILITY: [facility] LOCATION: [facility] PHYSICIAN: Donnice BIRCH. 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  PROCEDURE:  Revision left total knee arthroplasty.  COMPONENTS USED:  Depuy Attune revision knee system with a size 4 Attune revision tibial tray with a 45 press-fit sleeve and a 14 x 50 mm cemented stem.  On the femoral side, we used size 4 revision left femoral component with a 14 x 80 cemented stem and  a 4 mm posterior medial augment. We then used a size 8-mm revision cruciate sacrificing polyethylene insert.  The patella was retained.  SURGEON:  Donnice BIRCH. Ernie, MD  ASSISTANT:  Rosina Calin, PA-C.  Note that PA Calin was present for the entirety of the case from preoperative positioning and perioperative management of the operative extremity, general facilitation of the case, and primary wound closure.  ANESTHESIA:  Regional plus spinal.  BLOOD LOSS:  Less than 200 mL.  TOURNIQUET:  Up for 59 minutes at 225 mmHg.  DRAINS:  None.  COMPLICATIONS:  None apparent.  INDICATIONS FOR THE PROCEDURE:  The patient is a 68 year old female with a history of left total knee arthroplasty from 2014.  She had followed up recently with concerns and complaints about significant stiffness and limited range of motion of her left  knee.  She has had a subsequent right total knee replacement that she had done well with.  We had a lengthy discussion regarding her left knee and the effect on her quality of life and desired functional level.  We discussed revision knee replacement  surgery with the available data and sometimes inconsistent results for revision in a stiff knee setting.  The  patient wished to proceed based on the effect on her quality of life and function.  I reviewed with her persistent stiffness as a major concern,  risks of infection, DVT, component failure, and need for future surgeries.  She elected to proceed as noted.  Consent was obtained for the benefit of pain relief and improved range of motion.  DESCRIPTION OF PROCEDURE:  The patient was brought to the operative theater.  Once adequate anesthesia and preoperative antibiotics, Ancef  administered as well as tranexamic acid  and Decadron  she was positioned supine with the left thigh tourniquet was  placed.  The left lower extremity was then prepped and draped in a sterile fashion.  Timeout was performed identifying the patient, planned procedure, and extremity.  The leg was exsanguinated and the tourniquet elevated to 225 mmHg.  Preoperatively, I  assessed that her arc of motion she lacked at least 5 degrees of passive extension and flexed only to about 40 degrees maybe.  I used her old incision was used and extended it proximal and distal for exposure purposes.  Soft tissue planes were created.   We then made a median arthrotomy.  The first portion of the procedure was exposure.  I was able to do a proximal peel preserving the medial proximal tibia soft tissues.  I also elevated the soft tissues off the tibial tubercle slightly and there was no  complication with this.  I extended my quadriceps incision up through the quadriceps tendon.  I did not need to perform a quadriceps snip in total.  We did a significant scar and synovectomy including  the medial gutter, lateral gutter, and suprapatellar  region.  This was all done in stages assessing flexion while doing so preserving soft tissues.  I was able to remove the polyethylene insert.  I then focused on removing the femoral component.  I used a thin ACL saw undermining the bone cement interface.   We removed the femoral component with minimal bone loss.  I then brought  the knee extension and allowed for further debridement around the patella.  We did perform a significant scar and synovectomy laterally in the parapatellar region.  I removed any  identified old bone in the lateral facet region and around the patella.  I was then able to flex her knee well over 100 degrees, in order to perform the remainder of the procedure.  We used a thin ACL saw and undermined the bone cement interface on the  tibial side.  The tibial component was removed.  At this point, we removed all remaining cement.  I did make a proximal tibial cut underneath the cement interface.  Once the cement was removed, I reamed the distal femur and proximal tibia using reamers  up to 16 mm on both sides.  I then prepared the proximal tibia using a starting broach and broached up to a size 45 broach, which sat slightly proud consistent with the previously made cut.  We then placed a size 4 tibial tray onto this.  Once this was  done, I directed attention to the femoral side.  With a 16-mm reamer in place, I revised the distal femoral cut removing just a couple of mm of bone.  We then elected to use a size 4 femoral component.  The remaining cuts were made including the anterior  cut revisited, posterior cuts, and it was determined now we use a 4 mm posterior medial augment chamfer cuts and the box cut.  We then did a trial reduction with a size 4 femoral component with a 14 x 80 stem and the tibial tray in place.  With this, I  found that the knee came to full extension using the 6 insert.  The patella tracked through the trochlea without application of pressure with good flexion.  Given these findings, we removed all the trial components.  We measured and placed cement  restrictors in the distal femur and proximal tibia.  We then irrigated the knee with normal saline solution.  The posterior aspect of the knee was injected carefully with 0.25% Marcaine  with epinephrine  and 1 mL of Toradol  and saline.   Following the  irrigation and debridement, the final components, which had been opened and configured on the back table under direct supervision with Rosina Calin, cement was mixed.  The tibial component was impacted into place with a hybrid technique with cement  distally and the press-fit sleeve.  It sat basically at the level where the broach had.  I then cemented the femoral component in.  We then brought the knee to extension with an 8 mm at this point noting that the knee came to full extension again.  The  knee was held in extension until the cement fully cured.  Once this was done, any excessive cement was removed.  We selected 8-mm revision polyethylene and placed it into the knee.  The knee was reduced.  The tourniquet had been let down after 59 minutes  without significant hemostasis required.  We re-irrigated the knee.  The extensor mechanism was then reapproximated using a combination of #1 Vicryl  and #1 Stratafix sutures with the knee in approximately 40 degrees of flexion.  The remainder of the  wound was closed in layers with 2-0 Vicryl and a running Monocryl stitch.  The wound was cleaned, dried, and dressed sterilely using surgical glue and Aquacel dressing.  The patient was then brought to the recovery room in stable condition and tolerated  the procedure well.  Findings were reviewed with her daughter.  She will be admitted to the hospital.  We will use a CPM to try to help with her immediate range of motion as well as postop therapy and home exercise program.   PUS D: 01/02/2024 9:23:53 am T: 01/02/2024 10:53:00 am  JOB: [id number]/ [id number]

## 2024-01-02 NOTE — Anesthesia Procedure Notes (Signed)
 Anesthesia Regional Block: Adductor canal block   Pre-Anesthetic Checklist: , timeout performed,  Correct Patient, Correct Site, Correct Laterality,  Correct Procedure, Correct Position, site marked,  Risks and benefits discussed,  Surgical consent,  Pre-op evaluation,  At surgeon's request and post-op pain management  Laterality: Left and Lower  Prep: chloraprep       Needles:  Injection technique: Single-shot  Needle Type: Echogenic Needle     Needle Length: 9cm  Needle Gauge: 21     Additional Needles:   Procedures:,,,, ultrasound used (permanent image in chart),,    Narrative:  Start time: 01/02/2024 6:57 AM End time: 01/02/2024 7:03 AM Injection made incrementally with aspirations every 5 mL.  Performed by: Personally  Anesthesiologist: Leonce Athens, MD  Additional Notes: Pt identified in Holding room.  Monitors applied. Working IV access confirmed. Timeout, Sterile prep L thigh.  #21ga ECHOgenic Arrow block needle into adductor canal with US  guidance.  20cc 0.75% Ropivacaine  injected incrementally after negative test dose.  Patient asymptomatic, VSS, no heme aspirated, tolerated well.   Tracey Foster Leonce, MD

## 2024-01-03 ENCOUNTER — Encounter (HOSPITAL_COMMUNITY): Payer: Self-pay | Admitting: Orthopedic Surgery

## 2024-01-03 LAB — BASIC METABOLIC PANEL WITH GFR
Anion gap: 10 (ref 5–15)
BUN: 30 mg/dL — ABNORMAL HIGH (ref 8–23)
CO2: 22 mmol/L (ref 22–32)
Calcium: 8.4 mg/dL — ABNORMAL LOW (ref 8.9–10.3)
Chloride: 100 mmol/L (ref 98–111)
Creatinine, Ser: 1.25 mg/dL — ABNORMAL HIGH (ref 0.44–1.00)
GFR, Estimated: 47 mL/min — ABNORMAL LOW (ref >60)
Glucose, Bld: 207 mg/dL — ABNORMAL HIGH (ref 70–99)
Potassium: 3.6 mmol/L (ref 3.5–5.1)
Sodium: 132 mmol/L — ABNORMAL LOW (ref 135–145)

## 2024-01-03 LAB — CBC
HCT: 33.4 % — ABNORMAL LOW (ref 36.0–46.0)
Hemoglobin: 10.9 g/dL — ABNORMAL LOW (ref 12.0–15.0)
MCH: 28.2 pg (ref 26.0–34.0)
MCHC: 32.6 g/dL (ref 30.0–36.0)
MCV: 86.3 fL (ref 80.0–100.0)
Platelets: 203 K/uL (ref 150–400)
RBC: 3.87 MIL/uL (ref 3.87–5.11)
RDW: 13.3 % (ref 11.5–15.5)
WBC: 12.2 K/uL — ABNORMAL HIGH (ref 4.0–10.5)
nRBC: 0 % (ref 0.0–0.2)

## 2024-01-03 LAB — GLUCOSE, CAPILLARY: Glucose-Capillary: 163 mg/dL — ABNORMAL HIGH (ref 70–99)

## 2024-01-03 MED ORDER — POLYETHYLENE GLYCOL 3350 17 G PO PACK: 17.0000 g | PACK | Freq: Two times a day (BID) | ORAL | 0 refills | Status: AC

## 2024-01-03 MED ORDER — OXYCODONE HCL 5 MG PO TABS: 5.0000 mg | ORAL_TABLET | ORAL | 0 refills | Status: AC | PRN

## 2024-01-03 MED ORDER — ASPIRIN 81 MG PO CHEW: 81.0000 mg | CHEWABLE_TABLET | Freq: Two times a day (BID) | ORAL | 0 refills | Status: AC

## 2024-01-03 MED ORDER — METHOCARBAMOL 500 MG PO TABS
500.0000 mg | ORAL_TABLET | Freq: Four times a day (QID) | ORAL | 2 refills | Status: AC | PRN
Start: 2024-01-03 — End: ?

## 2024-01-03 NOTE — Progress Notes (Signed)
 Patient ID: Tracey Foster, female   DOB: [birth year], 68 y.o.   MRN: [id number] Subjective: 1 Day Post-Op Procedure(s) (LRB): TOTAL KNEE REVISION (Left)    Patient reports pain as mild. Doing well.  No adverse events yesterday Worked with PT yesterday  Objective:   VITALS:   Vitals:   01/03/24 0152 01/03/24 0618  BP: 94/67 131/61  Pulse: 66 (!) 54  Resp: 15 18  Temp: (!) 97.5 F (36.4 C) 97.6 F (36.4 C)  SpO2: 99% 98%    Neurovascular intact Incision: dressing C/D/I  LABS Recent Labs    01/03/24 0319  HGB 10.9*  HCT 33.4*  WBC 12.2*  PLT 203    Recent Labs    01/03/24 0319  NA 132*  K 3.6  BUN 30*  CREATININE 1.25*  GLUCOSE 207*    No results for input(s): LABPT, INR in the last 72 hours.   Assessment/Plan: 1 Day Post-Op Procedure(s) (LRB): TOTAL KNEE REVISION (Left)   Advance diet Up with therapy As well as she is doing she will likely be able to go home today after therapy Reviewed goals and things to work on We had discussed a CPM for home and will work to arrange for this at discharge RTC in 2 weeks

## 2024-01-03 NOTE — Progress Notes (Signed)
 Physical Therapy Treatment Patient Details Name: Tracey Foster MRN: [id number] DOB: May 16, 1956 Today's Date: 01/03/2024   History of Present Illness 68 y.o. female admitted 01/02/24 for revision of L TKA. PMH: B TKA, HTN, DM, obesity, endometrial cancer.    PT Comments  Pt is progressing well with mobility, she ambulated 200' with RW, no loss of balance nor buckling. Stair training completed. Pt demonstrates good understanding of TKA HEP. She is ready to DC home from a PT standpoint.     If plan is discharge home, recommend the following: Assistance with cooking/housework;Assist for transportation;Help with stairs or ramp for entrance;A little help with bathing/dressing/bathroom;A little help with walking and/or transfers   Can travel by private vehicle        Equipment Recommendations  None recommended by PT    Recommendations for Other Services       Precautions / Restrictions Precautions Precautions: Fall;Knee Precaution Booklet Issued: Yes (comment) Precaution/Restrictions Comments: reviewed no pillow under knee Restrictions Weight Bearing Restrictions Per Provider Order: No Other Position/Activity Restrictions: WBAT LLE     Mobility  Bed Mobility               General bed mobility comments: up on toilet    Transfers Overall transfer level: Needs assistance Equipment used: Rolling walker (2 wheels) Transfers: Sit to/from Stand Sit to Stand: Supervision           General transfer comment: VCs hand placement    Ambulation/Gait Ambulation/Gait assistance: Modified independent (Device/Increase time) Gait Distance (Feet): 200 Feet Assistive device: Rolling walker (2 wheels) Gait Pattern/deviations: Step-to pattern, Decreased step length - right, Decreased step length - left Gait velocity: decr     General Gait Details: steady, no LOB, no buckling   Stairs Stairs: Yes Stairs assistance: Contact guard assist Stair Management: One rail Left, Forwards,  With cane, Step to pattern Number of Stairs: 3 General stair comments: VCs sequencing   Wheelchair Mobility     Tilt Bed    Modified Rankin (Stroke Patients Only)       Balance Overall balance assessment: Modified Independent                                          Communication Communication Communication: No apparent difficulties  Cognition Arousal: Alert Behavior During Therapy: WFL for tasks assessed/performed   PT - Cognitive impairments: No apparent impairments                         Following commands: Intact      Cueing Cueing Techniques: Verbal cues  Exercises Total Joint Exercises Ankle Circles/Pumps: AROM, Both, 10 reps, Supine Quad Sets: AROM, Both, 5 reps, Supine Short Arc Quad: AROM, Left, 5 reps, Supine Heel Slides: AAROM, Left, 5 reps, Supine Hip ABduction/ADduction: AROM, Left, 5 reps, Supine Straight Leg Raises: AROM, Left, 5 reps, Supine Long Arc Quad: AROM, Left, 5 reps, Seated Knee Flexion: AAROM, Left, 5 reps, Seated Goniometric ROM: 5-45* AAROM L knee    General Comments        Pertinent Vitals/Pain Pain Assessment Pain Score: 2  Pain Location: L knee Pain Descriptors / Indicators: Sore Pain Intervention(s): Limited activity within patient's tolerance, Monitored during session, Premedicated before session, Ice applied    Home Living  Prior Function            PT Goals (current goals can now be found in the care plan section) Acute Rehab PT Goals Patient Stated Goal: walk without pain PT Goal Formulation: With patient Time For Goal Achievement: 01/09/24 Potential to Achieve Goals: Good Progress towards PT goals: Goals met/education completed, patient discharged from PT    Frequency    7X/week      PT Plan      Co-evaluation              AM-PAC PT 6 Clicks Mobility   Outcome Measure  Help needed turning from your back to your side while in a  flat bed without using bedrails?: None Help needed moving from lying on your back to sitting on the side of a flat bed without using bedrails?: None Help needed moving to and from a bed to a chair (including a wheelchair)?: None Help needed standing up from a chair using your arms (e.g., wheelchair or bedside chair)?: None Help needed to walk in hospital room?: None Help needed climbing 3-5 steps with a railing? : None 6 Click Score: 24    End of Session Equipment Utilized During Treatment: Gait belt Activity Tolerance: Patient tolerated treatment well Patient left: in chair;with call bell/phone within reach;with chair alarm set;with family/visitor present Nurse Communication: Mobility status PT Visit Diagnosis: Difficulty in walking, not elsewhere classified (R26.2);Pain Pain - Right/Left: Left Pain - part of body: Knee     Time: 9153-9089 PT Time Calculation (min) (ACUTE ONLY): 24 min  Charges:    $Gait Training: 8-22 mins $Therapeutic Exercise: 8-22 mins PT General Charges $$ ACUTE PT VISIT: 1 Visit                     Sylvan Delon Copp PT 01/03/2024  Acute Rehabilitation Services  Office [phone]

## 2024-01-03 NOTE — Care Management Important Message (Signed)
 Important Message  Patient Details IM Letter given. Name: Tracey Foster MRN: [id number] Date of Birth: 21-Oct-1955   Important Message Given:  Yes - Medicare IM     Destanie Tibbetts 01/03/2024, 9:28 AM

## 2024-01-03 NOTE — Progress Notes (Signed)
 Discharge paperwork printed and reviewed with the pt. Pt made aware to pick up prescription at [facility]. Pt verbalized understanding and no concerns made. Daughter present in the room to provide transportation.

## 2024-01-03 NOTE — TOC Transition Note (Signed)
 Transition of Care [facility]) - Discharge Note   Patient Details  Name: Tracey Foster MRN: [id number] Date of Birth: 11/04/1955  [facility] CM/SW Contact:  NORMAN ASPEN, LCSW Phone Number: 01/03/2024, 10:50 AM   Clinical Narrative:     Confirming pt has all needed DME at home, however, asking about CPM.  Alerted PA who will request order be sent to [facility] from MD office.  OPPT already arranged with [facility] ([place]).  No further TOC needs.  Final next level of care: OP Rehab Barriers to Discharge: No Barriers Identified   Patient Goals and CMS Choice Patient states their goals for this hospitalization and ongoing recovery are:: return home          Discharge Placement                       Discharge Plan and Services Additional resources added to the After Visit Summary for                  DME Arranged: N/A DME Agency: NA                  Social Drivers of Health (SDOH) Interventions SDOH Screenings   Food Insecurity: No Food Insecurity (01/02/2024)  Housing: Low Risk  (01/02/2024)  Transportation Needs: No Transportation Needs (01/02/2024)  Utilities: Not At Risk (01/02/2024)  Social Connections: Moderately Integrated (01/02/2024)  Tobacco Use: Low Risk  (01/02/2024)     Readmission Risk Interventions    01/03/2024   10:49 AM  Readmission Risk Prevention Plan  Post Dischage Appt Complete  Transportation Screening Complete

## 2024-01-10 NOTE — Discharge Summary (Signed)
 Patient ID: Tracey Foster MRN: [id number] DOB/AGE: [birth year] 68 y.o.  Admit date: 01/02/2024 Discharge date: 01/03/2024  Admission Diagnoses:  Failed left total knee arthroplasty   Discharge Diagnoses:  Principal Problem:   S/P revision of total knee, left   Past Medical History:  Diagnosis Date   Arthritis    Diabetes mellitus without complication (HCC)    type 2   Endometrial cancer (HCC)    High cholesterol    Hypertension    Obesity (BMI 30.0-34.9)     Surgeries: Procedure(s): TOTAL KNEE REVISION on 01/02/2024   Consultants:   Discharged Condition: Improved  Hospital Course: TYLISHA DANIS is an 68 y.o. female who was admitted 01/02/2024 for operative treatment ofS/P revision of total knee, left. Patient has severe unremitting pain that affects sleep, daily activities, and work/hobbies. After pre-op clearance the patient was taken to the operating room on 01/02/2024 and underwent  Procedure(s): TOTAL KNEE REVISION.    Patient was given perioperative antibiotics:  Anti-infectives (From admission, onward)    Start     Dose/Rate Route Frequency Ordered Stop   01/02/24 1400  ceFAZolin  (ANCEF ) IVPB 2g/100 mL premix        2 g 200 mL/hr over 30 Minutes Intravenous Every 6 hours 01/02/24 1214 01/02/24 2118   01/02/24 0600  ceFAZolin  (ANCEF ) IVPB 2g/100 mL premix        2 g 200 mL/hr over 30 Minutes Intravenous On call to O.R. 01/02/24 0545 01/02/24 0749        Patient was given sequential compression devices, early ambulation, and chemoprophylaxis to prevent DVT. Patient worked with PT and was meeting their goals regarding safe ambulation and transfers.  Patient benefited maximally from hospital stay and there were no complications.    Recent vital signs: No data found.   Recent laboratory studies: No results for input(s): WBC, HGB, HCT, PLT, NA, K, CL, CO2, BUN, CREATININE, GLUCOSE, INR, CALCIUM in the last 72 hours.  Invalid input(s): PT,  2   Discharge Medications:   Allergies as of 01/03/2024       Reactions   Penicillins Hives   Did it involve swelling of the face/tongue/throat, SOB, or low BP? No Did it involve sudden or severe rash/hives, skin peeling, or any reaction on the inside of your mouth or nose? Yes Did you need to seek medical attention at a hospital or doctor's office? No When did it last happen? ~15 years ago       If all above answers are NO, may proceed with cephalosporin use. Tolerated Cephalosporin Date: 01/03/24.        Medication List     STOP taking these medications    ibuprofen  200 MG tablet Commonly known as: ADVIL        TAKE these medications    aspirin  81 MG chewable tablet Chew 1 tablet (81 mg total) by mouth 2 (two) times daily for 28 days.   clindamycin  300 MG capsule Commonly known as: CLEOCIN  Take 900 mg by mouth See admin instructions. TAKE 3 CAPSULES (900 MG) BY MOUTH 1 HOUR PRIOR TO DENTAL APPOINTMENTS   ezetimibe -simvastatin  10-40 MG tablet Commonly known as: VYTORIN  Take 1 tablet by mouth at bedtime.   ferrous sulfate  324 MG Tbec Take 324 mg by mouth every Monday, Wednesday, and Friday. In the morning.   Jardiance  25 MG Tabs tablet Generic drug: empagliflozin  Take 25 mg by mouth in the morning.   lisinopril -hydrochlorothiazide  10-12.5 MG tablet Commonly known as: ZESTORETIC  Take 1 tablet  by mouth every evening.   metFORMIN  1000 MG tablet Commonly known as: GLUCOPHAGE  Take 1,000 mg by mouth 2 (two) times daily with a meal. Lunch and supper   methocarbamol  500 MG tablet Commonly known as: ROBAXIN  Take 1 tablet (500 mg total) by mouth every 6 (six) hours as needed for muscle spasms.   nystatin  powder Commonly known as: MYCOSTATIN /NYSTOP  Apply 1 Application topically 3 (three) times daily. What changed:  when to take this reasons to take this   oxyCODONE  5 MG immediate release tablet Commonly known as: Oxy IR/ROXICODONE  Take 1 tablet (5 mg total)  by mouth every 4 (four) hours as needed for severe pain (pain score 7-10).   Ozempic (0.25 or 0.5 MG/DOSE) 2 MG/3ML Sopn Generic drug: Semaglutide(0.25 or 0.5MG /DOS) Inject 0.5 mg into the skin every Sunday.   polyethylene glycol 17 g packet Commonly known as: MIRALAX  / GLYCOLAX  Take 17 g by mouth 2 (two) times daily.   Vitamin D3 50 MCG (2000 UT) Tabs Take 2,000 Units by mouth in the morning.   VITEYES AREDS FORMULA PO Take 1 capsule by mouth in the morning and at bedtime.               Discharge Care Instructions  (From admission, onward)           Start     Ordered   01/03/24 0000  Change dressing       Comments: Maintain surgical dressing until follow up in the clinic. If the edges start to pull up, may reinforce with tape. If the dressing is no longer working, may remove and cover with gauze and tape, but must keep the area dry and clean.  Call with any questions or concerns.   01/03/24 0747            Diagnostic Studies: No results found.  Disposition: Discharge disposition: 01-Home or Self Care       Discharge Instructions     Call MD / Call 911   Complete by: As directed    If you experience chest pain or shortness of breath, CALL 911 and be transported to the hospital emergency room.  If you develope a fever above 101 F, pus (white drainage) or increased drainage or redness at the wound, or calf pain, call your surgeon's office.   Change dressing   Complete by: As directed    Maintain surgical dressing until follow up in the clinic. If the edges start to pull up, may reinforce with tape. If the dressing is no longer working, may remove and cover with gauze and tape, but must keep the area dry and clean.  Call with any questions or concerns.   Constipation Prevention   Complete by: As directed    Drink plenty of fluids.  Prune juice may be helpful.  You may use a stool softener, such as Colace (over the counter) 100 mg twice a day.  Use MiraLax   (over the counter) for constipation as needed.   Diet - low sodium heart healthy   Complete by: As directed    Increase activity slowly as tolerated   Complete by: As directed    Weight bearing as tolerated with assist device (walker, cane, etc) as directed, use it as long as suggested by your surgeon or therapist, typically at least 4-6 weeks.   Post-operative opioid taper instructions:   Complete by: As directed    POST-OPERATIVE OPIOID TAPER INSTRUCTIONS: It is important to wean off of your opioid medication  as soon as possible. If you do not need pain medication after your surgery it is ok to stop day one. Opioids include: Codeine, Hydrocodone (Norco, Vicodin), Oxycodone (Percocet, oxycontin ) and hydromorphone  amongst others.  Long term and even short term use of opiods can cause: Increased pain response Dependence Constipation Depression Respiratory depression And more.  Withdrawal symptoms can include Flu like symptoms Nausea, vomiting And more Techniques to manage these symptoms Hydrate well Eat regular healthy meals Stay active Use relaxation techniques(deep breathing, meditating, yoga) Do Not substitute Alcohol to help with tapering If you have been on opioids for less than two weeks and do not have pain than it is ok to stop all together.  Plan to wean off of opioids This plan should start within one week post op of your joint replacement. Maintain the same interval or time between taking each dose and first decrease the dose.  Cut the total daily intake of opioids by one tablet each day Next start to increase the time between doses. The last dose that should be eliminated is the evening dose.      TED hose   Complete by: As directed    Use stockings (TED hose) for 2 weeks on both leg(s).  You may remove them at night for sleeping.        Follow-up Information     Ernie Cough, MD. Schedule an appointment as soon as possible for a visit in 2 week(s).    Specialty: Orthopedic Surgery Contact information: [address] [place] [address] [place] [address] [phone]                  Signed: Rosina JONELLE Calin 01/10/2024, 7:18 AM

## 2024-01-15 ENCOUNTER — Encounter (HOSPITAL_BASED_OUTPATIENT_CLINIC_OR_DEPARTMENT_OTHER): Payer: Self-pay

## 2024-01-15 ENCOUNTER — Ambulatory Visit (HOSPITAL_BASED_OUTPATIENT_CLINIC_OR_DEPARTMENT_OTHER)
Admission: RE | Admit: 2024-01-15 | Discharge: 2024-01-15 | Disposition: A | Discharge status: Home / Self Care | Source: Ambulatory Visit | Attending: Family Medicine | Admitting: Family Medicine

## 2024-01-15 VITALS — BP 105/72 | HR 84 | Temp 97.9°F | Resp 18

## 2024-01-15 DIAGNOSIS — R300 Dysuria: Secondary | ICD-10-CM

## 2024-01-15 LAB — POCT URINE DIPSTICK
Bilirubin, UA: NEGATIVE
Blood, UA: NEGATIVE
Glucose, UA: >=1000 mg/dL — AB
Ketones, POC UA: NEGATIVE mg/dL
Leukocytes, UA: NEGATIVE
Nitrite, UA: NEGATIVE
Protein Ur, POC: NEGATIVE mg/dL
Spec Grav, UA: 1.015 (ref 1.010–1.025)
Urobilinogen, UA: 0.2 U/dL
pH, UA: 5.5 (ref 5.0–8.0)

## 2024-01-15 NOTE — ED Triage Notes (Signed)
 Pt report yesterday she started having urinary frequency, urgency and burning.

## 2024-01-15 NOTE — Discharge Instructions (Signed)
 Your urine did not show any concerns for infection today.  We will culture just to be safe.  Recommend pushing fluids and we will call you if we need to treat any kind of infection.

## 2024-01-15 NOTE — ED Provider Notes (Signed)
 Tracey Foster    CSN: [id number] Arrival date & time: 01/15/24  1246      History   Chief Complaint Chief Complaint  Patient presents with   Urinary Frequency   Dysuria    HPI Tracey Foster is a 68 y.o. female.   68 year old female presents today with acute onset of dysuria, urinary retention and frequency.  Started yesterday.  Patient recent total knee replacement and did have catheter at that time.  Denies any itching, vaginal discharge or irritation.  No fever, back pain   Urinary Frequency  Dysuria   Past Medical History:  Diagnosis Date   Arthritis    Diabetes mellitus without complication (HCC)    type 2   Endometrial cancer (HCC)    High cholesterol    Hypertension    Obesity (BMI 30.0-34.9)     Patient Active Problem List   Diagnosis Date Noted   S/P revision of total knee, left 01/02/2024   Endometrial cancer (HCC) 09/02/2019   S/P left TKA 12/09/2012   Expected blood loss anemia 11/12/2012   Morbid obesity (HCC) 11/12/2012    Past Surgical History:  Procedure Laterality Date   CATARACT EXTRACTION, BILATERAL  2024   COLONOSCOPY     ROBOTIC ASSISTED TOTAL HYSTERECTOMY WITH BILATERAL SALPINGO OOPHERECTOMY Bilateral 09/11/2019   Procedure: XI ROBOTIC ASSISTED TOTAL HYSTERECTOMY WITH BILATERAL SALPINGO OOPHORECTOMY;  Surgeon: Viktoria Comer SAUNDERS, MD;  Location: [facility];  Service: Gynecology;  Laterality: Bilateral;   SENTINEL NODE BIOPSY N/A 09/11/2019   Procedure: SENTINEL NODE BIOPSY;  Surgeon: Viktoria Comer SAUNDERS, MD;  Location: [facility];  Service: Gynecology;  Laterality: N/A;   TOTAL KNEE ARTHROPLASTY Right 11/11/2012   Procedure: RIGHT TOTAL KNEE ARTHROPLASTY;  Surgeon: [name], MD;  Location: [facility];  Service: Orthopedics;  Laterality: Right;   TOTAL KNEE ARTHROPLASTY Left 12/09/2012   Procedure: LEFT TOTAL KNEE ARTHROPLASTY;  Surgeon: [name], MD;  Location: [facility];  Service: Orthopedics;  Laterality: Left;   TOTAL KNEE  REVISION Left 01/02/2024   Procedure: TOTAL KNEE REVISION;  Surgeon: Car Donnice, MD;  Location: [facility];  Service: Orthopedics;  Laterality: Left;   WISDOM TOOTH EXTRACTION      OB History     Gravida  1   Para  1   Term      Preterm      AB      Living         SAB      IAB      Ectopic      Multiple      Live Births               Home Medications    Prior to Admission medications   Medication Sig Start Date End Date Taking? Authorizing Provider  ezetimibe -simvastatin  (VYTORIN ) 10-40 MG per tablet Take 1 tablet by mouth at bedtime.   Yes [provider]  JARDIANCE  25 MG TABS tablet Take 25 mg by mouth in the morning. 08/17/19  Yes [provider]  lisinopril -hydrochlorothiazide  (ZESTORETIC ) 10-12.5 MG tablet Take 1 tablet by mouth every evening.   Yes [provider]  metFORMIN  (GLUCOPHAGE ) 1000 MG tablet Take 1,000 mg by mouth 2 (two) times daily with a meal. Lunch and supper 08/18/19  Yes [provider]  methocarbamol  (ROBAXIN ) 500 MG tablet Take 1 tablet (500 mg total) by mouth every 6 (six) hours as needed for muscle spasms. 01/03/24  Yes Patti Rosina SAUNDERS, PA-C  oxyCODONE  (OXY IR/ROXICODONE ) 5 MG immediate release tablet Take 1 tablet (5 mg total) by mouth every 4 (four) hours as needed for severe pain (pain score 7-10). 01/03/24  Yes Patti Knee R, PA-C  OZEMPIC, 0.25 OR 0.5 MG/DOSE, 2 MG/3ML SOPN Inject 0.5 mg into the skin every Sunday.   Yes [provider]  aspirin  81 MG chewable tablet Chew 1 tablet (81 mg total) by mouth 2 (two) times daily for 28 days. 01/03/24 01/31/24  Patti Knee SAUNDERS, PA-C  Cholecalciferol (VITAMIN D3) 50 MCG (2000 UT) TABS Take 2,000 Units by mouth in the morning.    [provider]  clindamycin  (CLEOCIN ) 300 MG capsule Take 900 mg by mouth See admin instructions. TAKE 3 CAPSULES (900 MG) BY MOUTH 1 HOUR PRIOR TO DENTAL APPOINTMENTS    [provider]  ferrous sulfate  324  MG TBEC Take 324 mg by mouth every Monday, Wednesday, and Friday. In the morning.    [provider]  Multiple Vitamins-Minerals (VITEYES AREDS FORMULA PO) Take 1 capsule by mouth in the morning and at bedtime.    [provider]  nystatin  (MYCOSTATIN /NYSTOP ) powder Apply 1 Application topically 3 (three) times daily. Patient taking differently: Apply 1 Application topically 3 (three) times daily as needed (skin fold rash). 10/19/22   Viktoria Comer SAUNDERS, MD  polyethylene glycol (MIRALAX  / GLYCOLAX ) 17 g packet Take 17 g by mouth 2 (two) times daily. 01/03/24   Patti Knee SAUNDERS, PA-C    Family History Family History  Problem Relation Age of Onset   Hypertension Mother    Hyperlipidemia Mother    Diabetes Father    Hypertension Father    Hypertension Paternal Grandmother    Diabetes Paternal Grandmother    Heart attack Paternal Grandmother    Hypertension Paternal Grandfather    Colon cancer Neg Hx    Uterine cancer Neg Hx    Breast cancer Neg Hx    Ovarian cancer Neg Hx     Social History Social History   Tobacco Use   Smoking status: Never   Smokeless tobacco: Never  Vaping Use   Vaping status: Never Used  Substance Use Topics   Alcohol use: No   Drug use: No     Allergies   Penicillins   Review of Systems Review of Systems  Genitourinary:  Positive for dysuria and frequency.     Physical Exam Triage Vital Signs ED Triage Vitals  Encounter Vitals Group     BP 01/15/24 1257 105/72     Girls Systolic BP Percentile --      Girls Diastolic BP Percentile --      Boys Systolic BP Percentile --      Boys Diastolic BP Percentile --      Pulse Rate 01/15/24 1257 84     Resp 01/15/24 1257 18     Temp 01/15/24 1257 97.9 F (36.6 C)     Temp Source 01/15/24 1257 Oral     SpO2 01/15/24 1257 96 %     Weight --      Height --      Head Circumference --      Peak Flow --      Pain Score 01/15/24 1255 0     Pain Loc --      Pain Education --       Exclude from Growth Chart --    No data found.  Updated Vital Signs BP 105/72 (BP Location: Right Arm)   Pulse  84   Temp 97.9 F (36.6 C) (Oral)   Resp 18   SpO2 96%   Visual Acuity Right Eye Distance:   Left Eye Distance:   Bilateral Distance:    Right Eye Near:   Left Eye Near:    Bilateral Near:     Physical Exam Vitals and nursing note reviewed.  Constitutional:      General: She is not in acute distress.    Appearance: Normal appearance. She is not ill-appearing, toxic-appearing or diaphoretic.  Pulmonary:     Effort: Pulmonary effort is normal.  Neurological:     Mental Status: She is alert.  Psychiatric:        Mood and Affect: Mood normal.      UC Treatments / Results  Labs (all labs ordered are listed, but only abnormal results are displayed) Labs Reviewed  POCT URINE DIPSTICK - Abnormal; Notable for the following components:      Result Value   Glucose, UA >=1,000 (*)    All other components within normal limits  URINE CULTURE    EKG   Radiology No results found.  Procedures Procedures (including critical Foster time)  Medications Ordered in UC Medications - No data to display  Initial Impression / Assessment and Plan / UC Course  I have reviewed the triage vital signs and the nursing notes.  Pertinent labs & imaging results that were available during my Foster of the patient were reviewed by me and considered in my medical decision making (see chart for details).     Dysuria- urinalysis normal besides elevated glucose.  Will send for culture.  Reports her symptoms are improved today compared to yesterday.  Recommend push fluids and will call if we need to treat for any urinary tract infection. Final Clinical Impressions(s) / UC Diagnoses   Final diagnoses:  Dysuria     Discharge Instructions      Your urine did not show any concerns for infection today.  We will culture just to be safe.  Recommend pushing fluids and we will call you  if we need to treat any kind of infection.    ED Prescriptions   None    PDMP not reviewed this encounter.   Adah Wilbert LABOR, FNP 01/15/24 1329

## 2024-01-20 ENCOUNTER — Ambulatory Visit (HOSPITAL_COMMUNITY): Payer: Self-pay

## 2024-01-20 LAB — URINE CULTURE: Culture: >=100000 — AB

## 2024-01-20 MED ORDER — SULFAMETHOXAZOLE-TRIMETHOPRIM 800-160 MG PO TABS: 1.0000 | ORAL_TABLET | Freq: Two times a day (BID) | ORAL | 0 refills | Status: AC

## 2024-02-11 ENCOUNTER — Other Ambulatory Visit (HOSPITAL_BASED_OUTPATIENT_CLINIC_OR_DEPARTMENT_OTHER): Payer: Self-pay | Admitting: Physician Assistant

## 2024-02-11 DIAGNOSIS — Z1231 Encounter for screening mammogram for malignant neoplasm of breast: Secondary | ICD-10-CM

## 2024-05-25 ENCOUNTER — Encounter (HOSPITAL_BASED_OUTPATIENT_CLINIC_OR_DEPARTMENT_OTHER): Payer: Self-pay | Admitting: Radiology

## 2024-05-25 ENCOUNTER — Ambulatory Visit (INDEPENDENT_AMBULATORY_CARE_PROVIDER_SITE_OTHER)
Admission: RE | Admit: 2024-05-25 | Discharge: 2024-05-25 | Disposition: A | Discharge status: Home / Self Care | Source: Ambulatory Visit | Attending: Physician Assistant | Admitting: Physician Assistant

## 2024-05-25 DIAGNOSIS — Z1231 Encounter for screening mammogram for malignant neoplasm of breast: Secondary | ICD-10-CM

## 2024-10-06 ENCOUNTER — Ambulatory Visit: Admitting: Gynecologic Oncology
